# Patient Record
Sex: Male | Born: 1955
Health system: Southern US, Community
[De-identification: ages and names within clinical notes are randomized; demographics above are authoritative.]

## PROBLEM LIST (undated history)

## (undated) DIAGNOSIS — E119 Type 2 diabetes mellitus without complications: Secondary | ICD-10-CM

## (undated) DIAGNOSIS — E785 Hyperlipidemia, unspecified: Secondary | ICD-10-CM

## (undated) DIAGNOSIS — I10 Essential (primary) hypertension: Secondary | ICD-10-CM

---

## 2002-03-25 ENCOUNTER — Inpatient Hospital Stay (HOSPITAL_COMMUNITY): Admission: EM | Admit: 2002-03-25 | Discharge: 2002-03-26 | Payer: Self-pay | Admitting: Internal Medicine

## 2004-10-06 ENCOUNTER — Inpatient Hospital Stay (HOSPITAL_COMMUNITY): Admission: EM | Admit: 2004-10-06 | Discharge: 2004-10-08 | Payer: Self-pay | Admitting: Emergency Medicine

## 2004-11-17 ENCOUNTER — Encounter: Admission: RE | Admit: 2004-11-17 | Discharge: 2004-11-17 | Payer: Self-pay | Admitting: General Surgery

## 2009-05-19 ENCOUNTER — Inpatient Hospital Stay (HOSPITAL_COMMUNITY): Admission: EM | Admit: 2009-05-19 | Discharge: 2009-05-27 | Payer: Self-pay

## 2010-06-29 ENCOUNTER — Emergency Department (HOSPITAL_COMMUNITY): Admission: EM | Admit: 2010-06-29 | Discharge: 2010-06-29 | Payer: Self-pay | Admitting: Emergency Medicine

## 2010-07-02 ENCOUNTER — Emergency Department (HOSPITAL_COMMUNITY): Admission: EM | Admit: 2010-07-02 | Discharge: 2010-07-02 | Payer: Self-pay | Admitting: Emergency Medicine
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
## 2011-03-08 LAB — WOUND CULTURE

## 2011-03-30 LAB — GLUCOSE, CAPILLARY
Glucose-Capillary: 141 mg/dL — ABNORMAL HIGH (ref 70–99)
Glucose-Capillary: 148 mg/dL — ABNORMAL HIGH (ref 70–99)
Glucose-Capillary: 148 mg/dL — ABNORMAL HIGH (ref 70–99)
Glucose-Capillary: 151 mg/dL — ABNORMAL HIGH (ref 70–99)
Glucose-Capillary: 152 mg/dL — ABNORMAL HIGH (ref 70–99)
Glucose-Capillary: 155 mg/dL — ABNORMAL HIGH (ref 70–99)
Glucose-Capillary: 155 mg/dL — ABNORMAL HIGH (ref 70–99)
Glucose-Capillary: 156 mg/dL — ABNORMAL HIGH (ref 70–99)
Glucose-Capillary: 159 mg/dL — ABNORMAL HIGH (ref 70–99)
Glucose-Capillary: 160 mg/dL — ABNORMAL HIGH (ref 70–99)
Glucose-Capillary: 162 mg/dL — ABNORMAL HIGH (ref 70–99)
Glucose-Capillary: 165 mg/dL — ABNORMAL HIGH (ref 70–99)
Glucose-Capillary: 175 mg/dL — ABNORMAL HIGH (ref 70–99)
Glucose-Capillary: 177 mg/dL — ABNORMAL HIGH (ref 70–99)
Glucose-Capillary: 177 mg/dL — ABNORMAL HIGH (ref 70–99)
Glucose-Capillary: 181 mg/dL — ABNORMAL HIGH (ref 70–99)
Glucose-Capillary: 181 mg/dL — ABNORMAL HIGH (ref 70–99)
Glucose-Capillary: 189 mg/dL — ABNORMAL HIGH (ref 70–99)
Glucose-Capillary: 192 mg/dL — ABNORMAL HIGH (ref 70–99)
Glucose-Capillary: 196 mg/dL — ABNORMAL HIGH (ref 70–99)
Glucose-Capillary: 398 mg/dL — ABNORMAL HIGH (ref 70–99)
Glucose-Capillary: 96 mg/dL (ref 70–99)

## 2011-03-30 LAB — CBC
HCT: 36.5 % — ABNORMAL LOW (ref 39.0–52.0)
Hemoglobin: 12.5 g/dL — ABNORMAL LOW (ref 13.0–17.0)
MCHC: 34.4 g/dL (ref 30.0–36.0)
MCHC: 34.7 g/dL (ref 30.0–36.0)
MCV: 92.8 fL (ref 78.0–100.0)
MCV: 93.7 fL (ref 78.0–100.0)
Platelets: 109 10*3/uL — ABNORMAL LOW (ref 150–400)
Platelets: 167 10*3/uL (ref 150–400)
Platelets: 183 10*3/uL (ref 150–400)
RBC: 3.07 MIL/uL — ABNORMAL LOW (ref 4.22–5.81)
RBC: 3.89 MIL/uL — ABNORMAL LOW (ref 4.22–5.81)
RDW: 12.8 % (ref 11.5–15.5)
RDW: 13.2 % (ref 11.5–15.5)
WBC: 10.8 10*3/uL — ABNORMAL HIGH (ref 4.0–10.5)
WBC: 8.1 10*3/uL (ref 4.0–10.5)
WBC: 9.6 10*3/uL (ref 4.0–10.5)

## 2011-03-30 LAB — DIFFERENTIAL
Basophils Absolute: 0 10*3/uL (ref 0.0–0.1)
Basophils Relative: 0 % (ref 0–1)
Eosinophils Absolute: 0.1 10*3/uL (ref 0.0–0.7)
Eosinophils Relative: 1 % (ref 0–5)
Lymphs Abs: 1.2 10*3/uL (ref 0.7–4.0)
Neutrophils Relative %: 74 % (ref 43–77)

## 2011-03-30 LAB — COMPREHENSIVE METABOLIC PANEL
ALT: 33 U/L (ref 0–53)
AST: 48 U/L — ABNORMAL HIGH (ref 0–37)
Albumin: 2.7 g/dL — ABNORMAL LOW (ref 3.5–5.2)
Alkaline Phosphatase: 57 U/L (ref 39–117)
BUN: 8 mg/dL (ref 6–23)
CO2: 29 mEq/L (ref 19–32)
Calcium: 8.2 mg/dL — ABNORMAL LOW (ref 8.4–10.5)
Chloride: 105 mEq/L (ref 96–112)
Creatinine, Ser: 0.67 mg/dL (ref 0.4–1.5)
GFR calc Af Amer: >60 mL/min (ref >60)
GFR calc non Af Amer: >60 mL/min (ref >60)
Glucose, Bld: 173 mg/dL — ABNORMAL HIGH (ref 70–99)
Potassium: 3.8 mEq/L (ref 3.5–5.1)
Sodium: 138 mEq/L (ref 135–145)
Total Bilirubin: 1.2 mg/dL (ref 0.3–1.2)
Total Protein: 5.8 g/dL — ABNORMAL LOW (ref 6.0–8.3)

## 2011-03-30 LAB — CROSSMATCH

## 2011-03-30 LAB — BASIC METABOLIC PANEL
BUN: 11 mg/dL (ref 6–23)
Chloride: 103 mEq/L (ref 96–112)
Creatinine, Ser: 0.6 mg/dL (ref 0.4–1.5)
Glucose, Bld: 158 mg/dL — ABNORMAL HIGH (ref 70–99)

## 2011-03-30 LAB — HEMOGLOBIN AND HEMATOCRIT, BLOOD
HCT: 28.5 % — ABNORMAL LOW (ref 39.0–52.0)
Hemoglobin: 9.8 g/dL — ABNORMAL LOW (ref 13.0–17.0)

## 2011-03-31 LAB — POCT I-STAT EG7
Bicarbonate: 18.9 meq/L — ABNORMAL LOW (ref 20.0–24.0)
Hemoglobin: 13.9 g/dL (ref 13.0–17.0)
O2 Saturation: 96 %
Potassium: 4.2 meq/L (ref 3.5–5.1)
TCO2: 20 mmol/L (ref 0–100)
pCO2, Ven: 38.8 mmHg — ABNORMAL LOW (ref 45.0–50.0)
pH, Ven: 7.295 (ref 7.250–7.300)
pO2, Ven: 93 mmHg — ABNORMAL HIGH (ref 30.0–45.0)

## 2011-03-31 LAB — POCT I-STAT 3, ART BLOOD GAS (G3+)
O2 Saturation: 92 %
Patient temperature: 98
pCO2 arterial: 44.5 mmHg (ref 35.0–45.0)

## 2011-03-31 LAB — CBC
Hemoglobin: 15.4 g/dL (ref 13.0–17.0)
MCHC: 34.1 g/dL (ref 30.0–36.0)
MCV: 93 fL (ref 78.0–100.0)
MCV: 93.3 fL (ref 78.0–100.0)
Platelets: 121 10*3/uL — ABNORMAL LOW (ref 150–400)
RBC: 4.85 MIL/uL (ref 4.22–5.81)
RDW: 12.8 % (ref 11.5–15.5)
WBC: 12.1 10*3/uL — ABNORMAL HIGH (ref 4.0–10.5)

## 2011-03-31 LAB — POCT I-STAT, CHEM 8
BUN: 8 mg/dL (ref 6–23)
Chloride: 103 meq/L (ref 96–112)
Creatinine, Ser: 1 mg/dL (ref 0.4–1.5)
Sodium: 140 meq/L (ref 135–145)

## 2011-03-31 LAB — GLUCOSE, CAPILLARY
Glucose-Capillary: 109 mg/dL — ABNORMAL HIGH (ref 70–99)
Glucose-Capillary: 175 mg/dL — ABNORMAL HIGH (ref 70–99)

## 2011-03-31 LAB — HEMOGLOBIN A1C
Hgb A1c MFr Bld: 7.6 % — ABNORMAL HIGH (ref 4.6–6.1)
Mean Plasma Glucose: 171 mg/dL

## 2011-03-31 LAB — BASIC METABOLIC PANEL
BUN: 8 mg/dL (ref 6–23)
Calcium: 8 mg/dL — ABNORMAL LOW (ref 8.4–10.5)
Chloride: 104 mEq/L (ref 96–112)
Creatinine, Ser: 0.83 mg/dL (ref 0.4–1.5)

## 2011-03-31 LAB — SAMPLE TO BLOOD BANK

## 2011-05-05 NOTE — Op Note (Signed)
NAMEJAEGER, TRUEHEART              ACCOUNT NO.:  [id number]   MEDICAL RECORD NO.:  [id number]          PATIENT TYPE:  INP   LOCATION:  2304                         FACILITY:  [facility]   PHYSICIAN:  Elana Alm. Thurston Hole, M.D. DATE OF BIRTH:  1956-08-16   DATE OF PROCEDURE:  05/19/2009  DATE OF DISCHARGE:                               OPERATIVE REPORT   PREOPERATIVE DIAGNOSIS:  Right hip posterior fracture dislocation.   POSTOPERATIVE DIAGNOSIS:  Right hip posterior fracture dislocation.   PROCEDURE:  Closed reduction with distal femoral traction pin placement,  right hip and femur.   SURGEON:  Elana Alm. Thurston Hole, MD   ASSISTANT:  Oris Drone. Petrarca, PA-C   ANESTHESIA:  General.   OPERATIVE TIME:  45 minutes.   COMPLICATIONS:  None.   DESCRIPTION OF PROCEDURE:  Mr. Zeitz was brought to the operating room  on May 19, 2009.  He was placed on operative table in supine position.  After being placed under general anesthesia, he had his right hip  reduced in a gentle flexion and longitudinal traction maneuver with  satisfactory closed reduction obtained.  Intraoperative fluoroscopy  confirmed anatomic reduction of the hip joint, although the posterior  wall fracture remained slightly displaced.  The hip was satisfactorily  reduced.  After this was done, then a distal femoral traction pin was  placed from lateral to medial, carefully avoiding neurovascular bundle  after the distal thigh and knee area was prepped and draped.  After this  pin was placed, then 25 pounds of longitudinal traction was placed.  The  patient was transferred back to his hospital bed and then another x-ray  after being transferred to the hospital bed AP pelvis x-ray was obtained  that showed the reduction was maintained.  At this point, the patient  was awakened, extubated, and taken to recovery room in stable condition.  Needle and sponge counts were correct x2 at the end of the case.      Robert A. Thurston Hole,  M.D.  Electronically Signed     RAW/MEDQ  D:  05/19/2009  T:  05/19/2009  Job:  [id number]
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
## 2011-05-08 NOTE — Discharge Summary (Signed)
[facility]  Patient:    Philip [name], Philip [name] Visit Number: [id number] MRN: [id number]          Service Type: MED Location: [phone] Attending Physician:  Georgann Housekeeper Dictated by:   Pearla Dubonnet, M.D. Admit Date:  03/25/2002 Discharge Date: 03/26/2002   CC:         Desma Maxim, M.D.   Discharge Summary  DATE OF BIRTH:  [birth year]  DISCHARGE DIAGNOSIS:  Scalp and facial cellulitis--improving.  DISCHARGE MEDICATIONS:  Keflex 500 mg four times a day for 10 days.  HOSPITAL COURSE:  Mr. Crutchfield is a pleasant 55 year old male who about six days prior to admission had hit his head on a garage door and suffered about a 2-cm laceration.  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  One day prior to admission, he developed fever, chills, malaise, vomiting, diarrhea, and rapidly spreading erythema over the left face and scalp.  He has had no eye pain or change in vision.  His temperature prior to admission was 101.6 but he has been afebrile for the last 18 hours.  He feels well in general and has had now had two doses of Unasyn to be followed by one right before discharge and, with the most likely organism being Strep and Staph, I think it is okay for him to be discharged at this point with the erythema no longer spreading and the patient afebrile and doing well.  PHYSICAL EXAMINATION:  HEENT:  On discharge, his erythema involved areas over the left parotid gland, left face, and almost entire upper scalp.  He had no areas of fluctuance and the rash had not progressed at all since admission, as outlined with a marker. There was no fluctuance over the area of healed laceration over the vertex of the skull.  LABORATORY DATA:  Laboratories on discharge reveal a white count of 12,200, hemoglobin 14.6, platelet count 148,000.   His white count on admission was 13,900 with 80% neutrophils.  There are no blood cultures reported for laboratory.  DISCHARGE INSTRUCTIONS:  The patient has been instructed to take his Keflex four times a day for 10 days and he has been instructed to not work on March 27, 2002 but, if he feels well on March 28, 2002 and the erythema is resolving, he can return to work.  He will contact Dr. Donia Guiles office in several days to let them know that he was hospitalized for a skin infection and to let them know how he is feeling.  Any temperature spike to 101 or greater or increase in erythema or if he notices worsening symptoms instead of improvement on a daily basis, he is to contact Dr. Fonnie Birkenhead office. Dictated by:   Pearla Dubonnet, M.D. Attending Physician:  Georgann Housekeeper DD:  03/26/02 TD:  03/27/02 Job: [id number]

## 2011-05-08 NOTE — H&P (Signed)
[name], [name]              ACCOUNT NO.:  [id number]   MEDICAL RECORD NO.:  [id number]          PATIENT TYPE:  INP   LOCATION:  0444                         FACILITY:  [facility]   PHYSICIAN:  Anselm Pancoast. Weatherly, M.D.DATE OF BIRTH:  October 26, 1956   DATE OF ADMISSION:  10/06/2004  DATE OF DISCHARGE:                                HISTORY & PHYSICAL   CHIEF COMPLAINT:  Lower abdominal pain approximately 2 1/2 days duration.   HISTORY:  Philip [name] is a 55 year old 180-pound male who was referred to  the emergency room after I was called from urgent care where the patient had  been seen the preceding evening with the following history.  The patient  states that on Friday, that was 2 1/2 days earlier, he had the onset of  lower abdominal pain.  I think this occurred after having a bowel movement.  He said he was able to take liquids, but he was sore.  Continued to be sore  for the next day and a half, but with increasing pain presented to the  urgent care where Dr. Reed Breech saw the patient and obtained laboratory  studies.  His findings were lower abdominal tenderness predominantly the  left.  White count was 11,200, and his clinical impression was that this was  probably acute diverticulitis, but he treated the patient with pain  medication and suggested he obtain a CT the following morning.  The  abdominal findings on the CT showed acute sigmoid diverticulitis with one  little small area of free air, but a fairly marked inflammatory response  along the sigmoid loop, and it was after this CT that they called and  suggested to send him to the emergency room.  He arrived there approximately  12:30 p.m. and he was not febrile, but was started on IV Unasyn and  Flagyl.  I saw the patient approximately 2 hours later whereon physical exam his  findings were definitely consistent with acute diverticulitis that is  localized and hopefully this is something that can be treated medically and  then a follow up either CT or barium enema performed.  Whether or not he  will need surgery is inconclusive at this time.  The patient's wife who was  not at home when he had the onset of  pain on Friday, she had gone to the  beach, said that he has had previous episodes of cramping, bloating, gas,  kind of complaining of lower abdominal discomfort, but had never actually  seen a physician or had any type of colon evaluations.   As far as past history, he gives no history of allergies.  He works as a  Curator.  Do not think he has actually had any previous surgeries.  No  history of hypertension or other problems.  He has had an ear surgery in the  past.  He was on no chronic medications.   PHYSICAL EXAMINATION:  VITAL SIGNS:  Temperature 97, pulse 73, respirations  20, blood pressure 136/80, he is 5 feet 11 inches and weighs 208 pounds.  HEENT:  Eyes, ears, nose, and throat normal.  Well-hydrated.  No cervical or  supraclavicular lymphadenopathy.  Good breath sounds bilaterally.  CARDIAC:  Normal sinus rhythm.  No cervical or supraclavicular or axillary  lymphadenopathy.  ABDOMEN:  He is definitely tender with mild muscle guarding in the left  lower quadrant fairly well localized and there is certainly not any  generalized peritonitis.  RECTUM:  Small amount of stool in the rectum.  Says he has had frequent  loose bowel movements over the last several days, but has not noticed any  blood.  EXTREMITIES:  No pedal edema.   PLAN:  The patient will be admitted, IV antibiotics, I am going to keep him  n.p.o. and re-examine him in the morning.  He clinically acts as if he  probably would not require urgent surgery as far as the inflammation of this  little minute area of free or extra luminal air.  It is thought to be well  localized, but possibly would develop into a pericolonic abscess, but  clinically he does not appear that acutely ill from my opinion.  We will  obtain repeat  laboratory studies for his white count in the morning and go  accordingly with advancing his diet with other tests.  I have discussed with  him about that if in the best case scenario his symptoms will subside,  switch him from IV to oral antibiotics, complete approximately two-week  course, and a followup either CT or barium enema according to how clinically  the patient.  He is aware that if he were to develop a generalized  peritonitis he might require emergency surgery that would require temporary  colostomy.      WJW/MEDQ  D:  10/07/2004  T:  10/07/2004  Job:  [id number]

## 2018-11-15 ENCOUNTER — Inpatient Hospital Stay (HOSPITAL_COMMUNITY)
Admission: EM | Admit: 2018-11-15 | Discharge: 2018-11-23 | DRG: 232 | Disposition: A | Discharge status: Home / Self Care | Payer: BLUE CROSS/BLUE SHIELD | Source: Ambulatory Visit | Attending: Cardiothoracic Surgery | Admitting: Cardiothoracic Surgery

## 2018-11-15 ENCOUNTER — Encounter (HOSPITAL_COMMUNITY): Payer: Self-pay

## 2018-11-15 ENCOUNTER — Emergency Department (HOSPITAL_COMMUNITY): Payer: BLUE CROSS/BLUE SHIELD

## 2018-11-15 ENCOUNTER — Encounter (HOSPITAL_COMMUNITY)
Admission: EM | Disposition: A | Discharge status: Home / Self Care | Payer: Self-pay | Source: Home / Self Care | Attending: Cardiothoracic Surgery

## 2018-11-15 DIAGNOSIS — E119 Type 2 diabetes mellitus without complications: Secondary | ICD-10-CM | POA: Diagnosis present

## 2018-11-15 DIAGNOSIS — I213 ST elevation (STEMI) myocardial infarction of unspecified site: Secondary | ICD-10-CM | POA: Diagnosis not present

## 2018-11-15 DIAGNOSIS — K5900 Constipation, unspecified: Secondary | ICD-10-CM | POA: Diagnosis present

## 2018-11-15 DIAGNOSIS — I255 Ischemic cardiomyopathy: Secondary | ICD-10-CM | POA: Diagnosis present

## 2018-11-15 DIAGNOSIS — E8770 Fluid overload, unspecified: Secondary | ICD-10-CM | POA: Diagnosis not present

## 2018-11-15 DIAGNOSIS — E785 Hyperlipidemia, unspecified: Secondary | ICD-10-CM | POA: Diagnosis present

## 2018-11-15 DIAGNOSIS — Z01810 Encounter for preprocedural cardiovascular examination: Secondary | ICD-10-CM | POA: Diagnosis not present

## 2018-11-15 DIAGNOSIS — I10 Essential (primary) hypertension: Secondary | ICD-10-CM

## 2018-11-15 DIAGNOSIS — R079 Chest pain, unspecified: Secondary | ICD-10-CM | POA: Diagnosis not present

## 2018-11-15 DIAGNOSIS — I2510 Atherosclerotic heart disease of native coronary artery without angina pectoris: Secondary | ICD-10-CM | POA: Diagnosis present

## 2018-11-15 DIAGNOSIS — I2119 ST elevation (STEMI) myocardial infarction involving other coronary artery of inferior wall: Secondary | ICD-10-CM | POA: Diagnosis not present

## 2018-11-15 DIAGNOSIS — Z4659 Encounter for fitting and adjustment of other gastrointestinal appliance and device: Secondary | ICD-10-CM

## 2018-11-15 DIAGNOSIS — D689 Coagulation defect, unspecified: Secondary | ICD-10-CM | POA: Diagnosis not present

## 2018-11-15 DIAGNOSIS — I2121 ST elevation (STEMI) myocardial infarction involving left circumflex coronary artery: Secondary | ICD-10-CM | POA: Diagnosis present

## 2018-11-15 DIAGNOSIS — D62 Acute posthemorrhagic anemia: Secondary | ICD-10-CM | POA: Diagnosis not present

## 2018-11-15 DIAGNOSIS — J90 Pleural effusion, not elsewhere classified: Secondary | ICD-10-CM

## 2018-11-15 DIAGNOSIS — I25110 Atherosclerotic heart disease of native coronary artery with unstable angina pectoris: Secondary | ICD-10-CM | POA: Diagnosis not present

## 2018-11-15 DIAGNOSIS — Z419 Encounter for procedure for purposes other than remedying health state, unspecified: Secondary | ICD-10-CM

## 2018-11-15 DIAGNOSIS — Z951 Presence of aortocoronary bypass graft: Secondary | ICD-10-CM

## 2018-11-15 DIAGNOSIS — I249 Acute ischemic heart disease, unspecified: Secondary | ICD-10-CM

## 2018-11-15 HISTORY — DX: Hyperlipidemia, unspecified: E78.5

## 2018-11-15 HISTORY — PX: LEFT HEART CATH AND CORONARY ANGIOGRAPHY: CATH118249

## 2018-11-15 HISTORY — DX: Type 2 diabetes mellitus without complications: E11.9

## 2018-11-15 HISTORY — DX: Essential (primary) hypertension: I10

## 2018-11-15 LAB — CBC
HEMATOCRIT: 44.9 % (ref 39.0–52.0)
HEMOGLOBIN: 14.9 g/dL (ref 13.0–17.0)
MCH: 30.2 pg (ref 26.0–34.0)
MCHC: 33.2 g/dL (ref 30.0–36.0)
MCV: 91.1 fL (ref 80.0–100.0)
Platelets: 209 10*3/uL (ref 150–400)
RBC: 4.93 MIL/uL (ref 4.22–5.81)
RDW: 11.8 % (ref 11.5–15.5)
WBC: 9.8 10*3/uL (ref 4.0–10.5)
nRBC: 0 % (ref 0.0–0.2)

## 2018-11-15 LAB — POCT ACTIVATED CLOTTING TIME: ACTIVATED CLOTTING TIME: 439 s

## 2018-11-15 LAB — BASIC METABOLIC PANEL
Anion gap: 9 (ref 5–15)
BUN: 8 mg/dL (ref 8–23)
CHLORIDE: 99 mmol/L (ref 98–111)
CO2: 27 mmol/L (ref 22–32)
Calcium: 10.5 mg/dL — ABNORMAL HIGH (ref 8.9–10.3)
Creatinine, Ser: 0.93 mg/dL (ref 0.61–1.24)
GFR calc Af Amer: >60 mL/min (ref >60)
GFR calc non Af Amer: >60 mL/min (ref >60)
Glucose, Bld: 272 mg/dL — ABNORMAL HIGH (ref 70–99)
POTASSIUM: 3.7 mmol/L (ref 3.5–5.1)
SODIUM: 135 mmol/L (ref 135–145)

## 2018-11-15 LAB — GLUCOSE, CAPILLARY: Glucose-Capillary: 203 mg/dL — ABNORMAL HIGH (ref 70–99)

## 2018-11-15 LAB — SURGICAL PCR SCREEN
MRSA, PCR: NEGATIVE
Staphylococcus aureus: NEGATIVE

## 2018-11-15 LAB — I-STAT TROPONIN, ED: Troponin i, poc: 17.9 ng/mL (ref 0.00–0.08)

## 2018-11-15 LAB — TROPONIN I: Troponin I: >65 ng/mL (ref <0.03)

## 2018-11-15 SURGERY — LEFT HEART CATH AND CORONARY ANGIOGRAPHY
Anesthesia: LOCAL

## 2018-11-15 MED ORDER — SODIUM CHLORIDE 0.9 % IV SOLN: 250.0000 mL | INTRAVENOUS | Status: DC | PRN

## 2018-11-15 MED ORDER — LIDOCAINE HCL (PF) 1 % IJ SOLN
INTRAMUSCULAR | Status: DC | PRN
  Administered 2018-11-15: 2 mL

## 2018-11-15 MED ORDER — HEPARIN (PORCINE) IN NACL 1000-0.9 UT/500ML-% IV SOLN
INTRAVENOUS | Status: DC | PRN
  Administered 2018-11-15: 500 mL

## 2018-11-15 MED ORDER — SODIUM CHLORIDE 0.9 % IV SOLN
INTRAVENOUS | Status: AC | PRN
  Administered 2018-11-15: 1.75 mg/kg/h via INTRAVENOUS

## 2018-11-15 MED ORDER — BIVALIRUDIN TRIFLUOROACETATE 250 MG IV SOLR
INTRAVENOUS | Status: AC
  Filled 2018-11-15: qty 250

## 2018-11-15 MED ORDER — SODIUM CHLORIDE 0.9% FLUSH
3.0000 mL | Freq: Two times a day (BID) | INTRAVENOUS | Status: DC
  Administered 2018-11-16 – 2018-11-17 (×4): 3 mL via INTRAVENOUS

## 2018-11-15 MED ORDER — ASPIRIN 81 MG PO CHEW
81.0000 mg | CHEWABLE_TABLET | Freq: Every day | ORAL | Status: DC
  Administered 2018-11-16 – 2018-11-17 (×2): 81 mg via ORAL
  Filled 2018-11-15 (×2): qty 1

## 2018-11-15 MED ORDER — HYDRALAZINE HCL 20 MG/ML IJ SOLN: 5.0000 mg | INTRAMUSCULAR | Status: AC | PRN

## 2018-11-15 MED ORDER — HEPARIN (PORCINE) IN NACL 1000-0.9 UT/500ML-% IV SOLN
INTRAVENOUS | Status: AC
  Filled 2018-11-15: qty 1000

## 2018-11-15 MED ORDER — SODIUM CHLORIDE 0.9 % IV SOLN
1.7500 mg/kg/h | INTRAVENOUS | Status: DC
  Administered 2018-11-15: 1.75 mg/kg/h via INTRAVENOUS
  Filled 2018-11-15: qty 250

## 2018-11-15 MED ORDER — HEPARIN BOLUS VIA INFUSION
4000.0000 [IU] | Freq: Once | INTRAVENOUS | Status: DC
  Filled 2018-11-15: qty 4000

## 2018-11-15 MED ORDER — TIROFIBAN (AGGRASTAT) BOLUS VIA INFUSION
INTRAVENOUS | Status: DC | PRN
  Administered 2018-11-15: 2017.5 ug via INTRAVENOUS

## 2018-11-15 MED ORDER — NITROGLYCERIN 1 MG/10 ML FOR IR/CATH LAB
INTRA_ARTERIAL | Status: DC | PRN
  Administered 2018-11-15 (×2): 200 ug via INTRACORONARY

## 2018-11-15 MED ORDER — VERAPAMIL HCL 2.5 MG/ML IV SOLN
INTRAVENOUS | Status: AC
  Filled 2018-11-15: qty 2

## 2018-11-15 MED ORDER — SODIUM CHLORIDE 0.9 % IV SOLN
1.7500 mg/kg/h | INTRAVENOUS | Status: DC
  Administered 2018-11-15 (×2): 1.75 mg/kg/h via INTRAVENOUS
  Filled 2018-11-15: qty 250

## 2018-11-15 MED ORDER — FENTANYL CITRATE (PF) 100 MCG/2ML IJ SOLN
INTRAMUSCULAR | Status: AC
  Filled 2018-11-15: qty 2

## 2018-11-15 MED ORDER — HEPARIN SODIUM (PORCINE) 1000 UNIT/ML IJ SOLN
INTRAMUSCULAR | Status: DC | PRN
  Administered 2018-11-15: 4000 [IU] via INTRAVENOUS

## 2018-11-15 MED ORDER — NITROGLYCERIN 0.4 MG SL SUBL
0.4000 mg | SUBLINGUAL_TABLET | SUBLINGUAL | Status: DC | PRN
  Administered 2018-11-15: 0.4 mg via SUBLINGUAL
  Filled 2018-11-15: qty 1

## 2018-11-15 MED ORDER — BIVALIRUDIN BOLUS VIA INFUSION - CUPID
INTRAVENOUS | Status: DC | PRN
  Administered 2018-11-15: 60.525 mg via INTRAVENOUS

## 2018-11-15 MED ORDER — ONDANSETRON HCL 4 MG/2ML IJ SOLN
4.0000 mg | Freq: Four times a day (QID) | INTRAMUSCULAR | Status: DC | PRN
  Administered 2018-11-18: 4 mg via INTRAVENOUS

## 2018-11-15 MED ORDER — TIROFIBAN HCL IV 12.5 MG/250 ML
INTRAVENOUS | Status: AC
  Filled 2018-11-15: qty 250

## 2018-11-15 MED ORDER — ATORVASTATIN CALCIUM 80 MG PO TABS
80.0000 mg | ORAL_TABLET | Freq: Every day | ORAL | Status: DC
  Administered 2018-11-16 – 2018-11-22 (×6): 80 mg via ORAL
  Filled 2018-11-15 (×6): qty 1

## 2018-11-15 MED ORDER — VERAPAMIL HCL 2.5 MG/ML IV SOLN
INTRAVENOUS | Status: DC | PRN
  Administered 2018-11-15: 10 mL via INTRA_ARTERIAL

## 2018-11-15 MED ORDER — SODIUM CHLORIDE 0.9 % IV SOLN: INTRAVENOUS | Status: AC

## 2018-11-15 MED ORDER — MIDAZOLAM HCL 2 MG/2ML IJ SOLN
INTRAMUSCULAR | Status: AC
  Filled 2018-11-15: qty 2

## 2018-11-15 MED ORDER — METOPROLOL TARTRATE 12.5 MG HALF TABLET
12.5000 mg | ORAL_TABLET | Freq: Two times a day (BID) | ORAL | Status: DC
  Administered 2018-11-15 – 2018-11-17 (×5): 12.5 mg via ORAL
  Filled 2018-11-15 (×5): qty 1

## 2018-11-15 MED ORDER — MIDAZOLAM HCL 2 MG/2ML IJ SOLN
INTRAMUSCULAR | Status: DC | PRN
  Administered 2018-11-15: 2 mg via INTRAVENOUS

## 2018-11-15 MED ORDER — INSULIN ASPART 100 UNIT/ML ~~LOC~~ SOLN
0.0000 [IU] | Freq: Three times a day (TID) | SUBCUTANEOUS | Status: DC
  Administered 2018-11-16: 5 [IU] via SUBCUTANEOUS
  Administered 2018-11-16: 2 [IU] via SUBCUTANEOUS
  Administered 2018-11-16 – 2018-11-17 (×2): 5 [IU] via SUBCUTANEOUS
  Administered 2018-11-17 (×2): 3 [IU] via SUBCUTANEOUS
  Administered 2018-11-18: 2 [IU] via SUBCUTANEOUS

## 2018-11-15 MED ORDER — HEPARIN (PORCINE) 25000 UT/250ML-% IV SOLN: 1000.0000 [IU]/h | INTRAVENOUS | Status: DC

## 2018-11-15 MED ORDER — ACETAMINOPHEN 325 MG PO TABS
650.0000 mg | ORAL_TABLET | ORAL | Status: DC | PRN
  Administered 2018-11-16: 650 mg via ORAL
  Filled 2018-11-15: qty 2

## 2018-11-15 MED ORDER — LABETALOL HCL 5 MG/ML IV SOLN: 10.0000 mg | INTRAVENOUS | Status: AC | PRN

## 2018-11-15 MED ORDER — SODIUM CHLORIDE 0.9 % IV SOLN
INTRAVENOUS | Status: AC | PRN
  Administered 2018-11-15: 10 mL/h via INTRAVENOUS
  Administered 2018-11-15: 81 mL/h via INTRAVENOUS

## 2018-11-15 MED ORDER — TIROFIBAN HCL IV 12.5 MG/250 ML
INTRAVENOUS | Status: AC | PRN
  Administered 2018-11-15: 0.15 ug/kg/min via INTRAVENOUS

## 2018-11-15 MED ORDER — DIAZEPAM 5 MG PO TABS: 5.0000 mg | ORAL_TABLET | Freq: Four times a day (QID) | ORAL | Status: DC | PRN

## 2018-11-15 MED ORDER — LIDOCAINE HCL (PF) 1 % IJ SOLN
INTRAMUSCULAR | Status: AC
  Filled 2018-11-15: qty 30

## 2018-11-15 MED ORDER — NITROGLYCERIN IN D5W 200-5 MCG/ML-% IV SOLN
INTRAVENOUS | Status: AC | PRN
  Administered 2018-11-15: 20 ug/min via INTRAVENOUS

## 2018-11-15 MED ORDER — NITROGLYCERIN IN D5W 200-5 MCG/ML-% IV SOLN: 0.0000 ug/min | INTRAVENOUS | Status: DC

## 2018-11-15 MED ORDER — FENTANYL CITRATE (PF) 100 MCG/2ML IJ SOLN
50.0000 ug | Freq: Once | INTRAMUSCULAR | Status: AC
  Administered 2018-11-15: 50 ug via INTRAVENOUS
  Filled 2018-11-15: qty 2

## 2018-11-15 MED ORDER — IOHEXOL 350 MG/ML SOLN
INTRAVENOUS | Status: DC | PRN
  Administered 2018-11-15: 180 mL via INTRACARDIAC

## 2018-11-15 MED ORDER — NITROGLYCERIN 1 MG/10 ML FOR IR/CATH LAB
INTRA_ARTERIAL | Status: AC
  Filled 2018-11-15: qty 10

## 2018-11-15 MED ORDER — NITROGLYCERIN IN D5W 200-5 MCG/ML-% IV SOLN
INTRAVENOUS | Status: AC
  Filled 2018-11-15: qty 250

## 2018-11-15 MED ORDER — TIROFIBAN HCL IN NACL 5-0.9 MG/100ML-% IV SOLN
0.1500 ug/kg/min | INTRAVENOUS | Status: AC
  Administered 2018-11-15 – 2018-11-16 (×3): 0.15 ug/kg/min via INTRAVENOUS
  Filled 2018-11-15 (×4): qty 100

## 2018-11-15 MED ORDER — SODIUM CHLORIDE 0.9% FLUSH: 3.0000 mL | INTRAVENOUS | Status: DC | PRN

## 2018-11-15 MED ORDER — FENTANYL CITRATE (PF) 100 MCG/2ML IJ SOLN
INTRAMUSCULAR | Status: DC | PRN
  Administered 2018-11-15: 50 ug via INTRAVENOUS
  Administered 2018-11-15: 25 ug via INTRAVENOUS

## 2018-11-15 SURGICAL SUPPLY — 18 items
BALLN SAPPHIRE 2.0X15 (BALLOONS) ×2
BALLN SAPPHIRE 2.5X15 (BALLOONS) ×2
BALLOON SAPPHIRE 2.0X15 (BALLOONS) ×1 IMPLANT
BALLOON SAPPHIRE 2.5X15 (BALLOONS) ×1 IMPLANT
CATH 5FR JL3.5 JR4 ANG PIG MP (CATHETERS) ×2 IMPLANT
CATH VISTA GUIDE 6FR XB3.5 (CATHETERS) ×2 IMPLANT
DEVICE RAD COMP TR BAND LRG (VASCULAR PRODUCTS) ×2 IMPLANT
ELECT DEFIB PAD ADLT CADENCE (PAD) ×2 IMPLANT
GLIDESHEATH SLEND SS 6F .021 (SHEATH) ×2 IMPLANT
GUIDEWIRE INQWIRE 1.5J.035X260 (WIRE) ×1 IMPLANT
INQWIRE 1.5J .035X260CM (WIRE) ×2
KIT ENCORE 26 ADVANTAGE (KITS) ×2 IMPLANT
KIT HEART LEFT (KITS) ×2 IMPLANT
PACK CARDIAC CATHETERIZATION (CUSTOM PROCEDURE TRAY) ×2 IMPLANT
SYR MEDRAD MARK 7 150ML (SYRINGE) ×2 IMPLANT
TRANSDUCER W/STOPCOCK (MISCELLANEOUS) ×2 IMPLANT
TUBING CIL FLEX 10 FLL-RA (TUBING) ×2 IMPLANT
WIRE PT2 MS 185 (WIRE) ×2 IMPLANT

## 2018-11-15 NOTE — ED Notes (Signed)
EDP at the bedside.  ?

## 2018-11-15 NOTE — ED Provider Notes (Signed)
[facility] Emergency Department Provider Note MRN:  [id number]  Arrival date & time: 11/15/18     Chief Complaint   Chest Pain   History of Present Illness   Philip [name] is a 62 y.o. year-old male with a history of hypertension, hyperlipidemia, diabetes presenting to the ED with chief complaint of chest pain.  Pain began last night, center of the chest, radiating to bilateral arms.  Described as a sharp/pressure pain.  Severe last night, improved today, currently 6 out of 10 in severity.  Denies headache or vision change, no fevers or cough recently, pain associated with shortness of breath, which is worse with exertion.  No abdominal pain.  Review of Systems  A complete 10 system review of systems was obtained and all systems are negative except as noted in the HPI and PMH.   Patient's Health History    Past Medical History:  Diagnosis Date  . Diabetes mellitus without complication (HCC)   . Hyperlipidemia   . Hypertension     History reviewed. No pertinent surgical history.  No family history on file.  Social History   Socioeconomic History  . Marital status: Single    Spouse name: Not on file  . Number of children: Not on file  . Years of education: Not on file  . Highest education level: Not on file  Occupational History  . Not on file  Social Needs  . Financial resource strain: Not on file  . Food insecurity:    Worry: Not on file    Inability: Not on file  . Transportation needs:    Medical: Not on file    Non-medical: Not on file  Tobacco Use  . Smoking status: Not on file  Substance and Sexual Activity  . Alcohol use: Not on file  . Drug use: Not on file  . Sexual activity: Not on file  Lifestyle  . Physical activity:    Days per week: Not on file    Minutes per session: Not on file  . Stress: Not on file  Relationships  . Social connections:    Talks on phone: Not on file    Gets together: Not on file    Attends religious  service: Not on file    Active member of club or organization: Not on file    Attends meetings of clubs or organizations: Not on file    Relationship status: Not on file  . Intimate partner violence:    Fear of current or ex partner: Not on file    Emotionally abused: Not on file    Physically abused: Not on file    Forced sexual activity: Not on file  Other Topics Concern  . Not on file  Social History Narrative  . Not on file     Physical Exam  Vital Signs and Nursing Notes reviewed Vitals:   11/15/18 2000 11/15/18 2100  BP: (!) 155/82 (!) 141/82  Pulse: 84 97  Resp: 15 10  Temp:    SpO2: 100% 100%    CONSTITUTIONAL: Well-appearing, NAD NEURO:  Alert and oriented x 3, no focal deficits EYES:  eyes equal and reactive ENT/NECK:  no LAD, no JVD CARDIO: Regular rate, well-perfused, normal S1 and S2 PULM:  CTAB no wheezing or rhonchi GI/GU:  normal bowel sounds, non-distended, non-tender MSK/SPINE:  No gross deformities, no edema SKIN:  no rash, atraumatic PSYCH:  Appropriate speech and behavior  Diagnostic and Interventional Summary    EKG  Interpretation  Date/Time:  Tuesday November 15 2018 15:13:16 EST Ventricular Rate:  89 PR Interval:  146 QRS Duration: 100 QT Interval:  368 QTC Calculation: 447 R Axis:   -17 Text Interpretation:  Normal sinus rhythm Low voltage QRS Possible Inferior infarct , age undetermined Abnormal ECG Confirmed by Kennis Carina [phone]) on 11/15/2018 4:35:27 PM      Labs Reviewed  BASIC METABOLIC PANEL - Abnormal; Notable for the following components:      Result Value   Glucose, Bld 272 (*)    Calcium 10.5 (*)    All other components within normal limits  TROPONIN I - Abnormal; Notable for the following components:   Troponin I >65.00 (*)    All other components within normal limits  I-STAT TROPONIN, ED - Abnormal; Notable for the following components:   Troponin i, poc 17.90 (*)    All other components within normal limits    SURGICAL PCR SCREEN  CBC  CBC  BASIC METABOLIC PANEL  HEPATIC FUNCTION PANEL  POCT ACTIVATED CLOTTING TIME    DG Chest 2 View  Final Result      Medications  nitroGLYCERIN (NITROSTAT) SL tablet 0.4 mg ( Sublingual MAR Unhold 11/15/18 1959)  labetalol (NORMODYNE,TRANDATE) injection 10 mg (has no administration in time range)  hydrALAZINE (APRESOLINE) injection 5 mg (has no administration in time range)  acetaminophen (TYLENOL) tablet 650 mg (has no administration in time range)  ondansetron (ZOFRAN) injection 4 mg (has no administration in time range)  0.9 %  sodium chloride infusion ( Intravenous IV Pump Association 11/15/18 2141)  sodium chloride flush (NS) 0.9 % injection 3 mL (has no administration in time range)  sodium chloride flush (NS) 0.9 % injection 3 mL (has no administration in time range)  0.9 %  sodium chloride infusion (has no administration in time range)  diazepam (VALIUM) tablet 5 mg (has no administration in time range)  atorvastatin (LIPITOR) tablet 80 mg (has no administration in time range)  aspirin chewable tablet 81 mg (has no administration in time range)  nitroGLYCERIN 50 mg in dextrose 5 % 250 mL (0.2 mg/mL) infusion (30 mcg/min Intravenous IV Pump Association 11/15/18 2000)  metoprolol tartrate (LOPRESSOR) tablet 12.5 mg (has no administration in time range)  tirofiban (AGGRASTAT) infusion 50 mcg/mL 100 mL (has no administration in time range)  bivalirudin (ANGIOMAX) 250 mg in sodium chloride 0.9 % 50 mL (5 mg/mL) infusion (has no administration in time range)  fentaNYL (SUBLIMAZE) injection 50 mcg (50 mcg Intravenous Given 11/15/18 1715)  0.9 %  sodium chloride infusion (  Rate/Dose Change 11/15/18 1919)  bivalirudin (ANGIOMAX) 250 mg in sodium chloride 0.9 % 50 mL (5 mg/mL) infusion (1.75 mg/kg/hr  80.7 kg Intravenous New Bag/Given 11/15/18 1842)  nitroGLYCERIN 50 mg in dextrose 5 % 250 mL (0.2 mg/mL) infusion (30 mcg/min Intravenous Rate/Dose Change  11/15/18 1921)  tirofiban (AGGRASTAT) infusion 50 mcg/mL 250 mL (  IV Pump Association 11/15/18 2000)     Procedures Critical Care Critical Care Documentation Critical care time provided by me (excluding procedures): 39 minutes  Condition necessitating critical care: Acute coronary syndrome  Components of critical care management: reviewing of prior records, laboratory and imaging interpretation, frequent re-examination and reassessment of vital signs, administration of nitroglycerin, IV heparin, discussion with consulting services    ED Course and Medical Decision Making  I have reviewed the triage vital signs and the nursing notes.  Pertinent labs & imaging results that were available during my care of the  patient were reviewed by me and considered in my medical decision making (see below for details).  I was called by triage nursing, informed that they were sending a chest pain patient back to room 39 with a troponin of 17.  I was able to look up his prior EKG, which was suspect for subtle ST elevation inferiorly.  Repeat EKG was done immediately upon arrival to his room, which revealed resolution of this ST elevation.  Currently without STEMI criteria.  Will give aspirin, heparin, manage pain, nitro tab, consult cardiology.  Discussed with cardiology, in the setting of resolved ST elevations, but a markedly elevated troponin and concerning story, will push for urgent catheterization.   Elmer SowMichael M. Pilar PlateBero, MD [facility] [email or web address]  Final Clinical Impressions(s) / ED Diagnoses     ICD-10-CM   1. Acute coronary syndrome [facility](HCC) I24.9     ED Discharge Orders    None         Sabas SousBero, Rebbecca Osuna M, MD 11/15/18 2207

## 2018-11-15 NOTE — Progress Notes (Signed)
ANTICOAGULATION CONSULT NOTE - Initial Consult  Pharmacy Consult for Heparin Indication: chest pain/ACS  No Known Allergies  Patient Measurements: Height: 5\' 11"  (180.3 cm) Weight: 178 lb (80.7 kg)(per patient) IBW/kg (Calculated) : 75.3 Heparin Dosing Weight: 80.7 kg  Vital Signs: Temp: 98.3 F (36.8 C) (11/26 1511) Temp Source: Oral (11/26 1511) BP: 168/88 (11/26 1511) Pulse Rate: 95 (11/26 1511)  Labs: Recent Labs    11/15/18 1545  HGB 14.9  HCT 44.9  PLT 209  CREATININE 0.93    Estimated Creatinine Clearance: 85.5 mL/min (by C-G formula based on SCr of 0.93 mg/dL).   Medical History: Past Medical History:  Diagnosis Date  . Diabetes mellitus without complication (HCC)   . Hyperlipidemia   . Hypertension     Assessment: Patient [age] yoM presenting with chest pain. Pharmacy has been consulted for heparin dosing. No anticoagulation PTA. HgB 14.9 and PLT 209.   Goal of Therapy:  Heparin level 0.3-0.7 units/ml Monitor platelets by anticoagulation protocol: Yes   Plan:  Give 4000 units bolus x 1 Start heparin infusion at 1000 units/hr Check anti-Xa level in 6 hours and daily while on heparin Continue to monitor H&H and platelets  Monitor s/sx of bleed  Chauncey Mannebecca Jillyn Stacey, Pharmacy Student

## 2018-11-15 NOTE — H&P (Signed)
Cardiology Admission History and Physical:   Patient ID: Philip [name] MRN: [id number]; DOB: 06-19-1956   Admission date: 11/15/2018  Primary Care Provider: Patient, No Pcp Per Primary Cardiologist: No primary care provider on file. New Primary Electrophysiologist:  None   Chief Complaint: Chest pain  Patient Profile:   Philip [name] is a 62 y.o. male with ST segment elevation myocardial infarction inferiorly with repeat EKG showing resolution.  Troponin 17  History of Present Illness:   Philip [name] is a 62 year old male with diabetes, hypertension, non-smoker with no early family history of coronary artery disease who began to have significant chest discomfort yesterday at approximately 2 PM that originally was intermittent.  He tried to burp to make it feel better.  Ended up going to sleep.  He woke up earlier than he usually does at approximately 3:30 AM and felt poor.  He fought through it and went to work and at 5:30 AM he was having significant chest discomfort and pressure with no radiation that almost put him to his knees he states.  His boss encouraged him to go to the doctor and he went to an urgent care who then transferred him or encouraged him to come to the emergency room.  He wanted to drive himself.  He states he had a bad experience once in an ambulance.  Initial ECG at 1545 demonstrated 1 to 2 mm of ST segment elevation in the inferior leads with biphasic T wave in V3.  On repeat EKG at 1649, ST segments have returned to baseline.  Currently in the emergency department, he is having 6/10 chest discomfort.  Blood pressure still remains elevated.  Chest x-ray personally reviewed shows no active cardiopulmonary disease.  Point-of-care troponin was 17.9.  Creatinine 0.93.  Glucose 272.   Past Medical History:  Diagnosis Date  . Diabetes mellitus without complication (HCC)   . Hyperlipidemia   . Hypertension     History reviewed. No pertinent surgical history.     Medications Prior to Admission: Prior to Admission medications   Not on File     Allergies:   No Known Allergies  Social History:   Social History   Socioeconomic History  . Marital status: Single    Spouse name: Not on file  . Number of children: Not on file  . Years of education: Not on file  . Highest education level: Not on file  Occupational History  . Not on file  Social Needs  . Financial resource strain: Not on file  . Food insecurity:    Worry: Not on file    Inability: Not on file  . Transportation needs:    Medical: Not on file    Non-medical: Not on file  Tobacco Use  . Smoking status: Not on file  Substance and Sexual Activity  . Alcohol use: Not on file  . Drug use: Not on file  . Sexual activity: Not on file  Lifestyle  . Physical activity:    Days per week: Not on file    Minutes per session: Not on file  . Stress: Not on file  Relationships  . Social connections:    Talks on phone: Not on file    Gets together: Not on file    Attends religious service: Not on file    Active member of club or organization: Not on file    Attends meetings of clubs or organizations: Not on file    Relationship status: Not on file  .  Intimate partner violence:    Fear of current or ex partner: Not on file    Emotionally abused: Not on file    Physically abused: Not on file    Forced sexual activity: Not on file  Other Topics Concern  . Not on file  Social History Narrative  . Not on file    Family History:   The patient's family history is not on file.  No early family history of CAD  ROS:  Please see the history of present illness.  Denies any fevers chills nausea vomiting syncope bleeding.  All other ROS reviewed and negative.     Physical Exam/Data:   Vitals:   11/15/18 1511 11/15/18 1700 11/15/18 1715  BP: (!) 168/88  (!) 183/97  Pulse: 95  83  Resp: 16  13  Temp: 98.3 F (36.8 C)    TempSrc: Oral    SpO2: 100%  98%  Weight:  80.7 kg    Height:  5\' 11"  (1.803 m)    No intake or output data in the 24 hours ending 11/15/18 1735 Filed Weights   11/15/18 1700  Weight: 80.7 kg   Body mass index is 24.83 kg/m.  General:  Well nourished, well developed, in no acute distress HEENT: normal Lymph: no adenopathy Neck: no JVD Endocrine:  No thryomegaly Vascular: No carotid bruits; FA pulses 2+ bilaterally without bruits, excellent radial pulses Cardiac:  normal S1, S2; RRR; no murmur  Lungs:  clear to auscultation bilaterally, no wheezing, rhonchi or rales  Abd: soft, nontender, no hepatomegaly  Ext: no edema Musculoskeletal:  No deformities, BUE and BLE strength normal and equal Skin: warm and dry, tattoos noted Neuro:  CNs 2-12 intact, no focal abnormalities noted Psych:  Normal affect    EKG:  The ECG that was done  was personally reviewed and demonstrates initially ST segment elevation in the inferior leads, subsequent EKG resolved.  See above for details.  Relevant CV Studies: Chronic catheterization pending  Laboratory Data:  Chemistry Recent Labs  Lab 11/15/18 1545  NA 135  K 3.7  CL 99  CO2 27  GLUCOSE 272*  BUN 8  CREATININE 0.93  CALCIUM 10.5*  GFRNONAA >60  GFRAA >60  ANIONGAP 9    No results for input(s): PROT, ALBUMIN, AST, ALT, ALKPHOS, BILITOT in the last 168 hours. Hematology Recent Labs  Lab 11/15/18 1545  WBC 9.8  RBC 4.93  HGB 14.9  HCT 44.9  MCV 91.1  MCH 30.2  MCHC 33.2  RDW 11.8  PLT 209   Cardiac EnzymesNo results for input(s): TROPONINI in the last 168 hours.  Recent Labs  Lab 11/15/18 1618  TROPIPOC 17.90*    BNPNo results for input(s): BNP, PROBNP in the last 168 hours.  DDimer No results for input(s): DDIMER in the last 168 hours.  Radiology/Studies:  Dg Chest 2 View  Result Date: 11/15/2018 CLINICAL DATA:  Chest pain EXAM: CHEST - 2 VIEW COMPARISON:  05/25/2009 FINDINGS: The heart size and mediastinal contours are within normal limits. Both lungs are  clear. Degenerative changes of the spine. Scattered fluid levels in the upper abdomen. IMPRESSION: No active cardiopulmonary disease. Electronically Signed   By: Jasmine PangKim  Fujinaga M.D.   On: 11/15/2018 15:38    Assessment and Plan:   ST segment elevation myocardial infarction resolved on repeat EKG with ongoing chest pain -Troponin 17, original EKG shows 1 to 2 mm ST segment elevation inferiorly with biphasic T wave in V3.  On repeat EKG  his ST segment elevation in the inferior leads are fairly isoelectric.  He still is having ongoing chest discomfort. -Based upon his clinical presentation, EKG findings, troponin of 17, we will proceed with urgent cardiac catheterization.  Risks and benefits have been explained to the patient including stroke heart attack death renal impairment bleeding.  He is willing to proceed.  Radial artery excellent. - Chest pain began yesterday afternoon 2 PM and was intermittent throughout the day, severe earlier this morning at work. -Cardiac risk factors include hypertension and diabetes.  Non-smoker.  No illicit drug use. - We will go ahead and aggressively treat his hypertension, beta-blocker.  He has been taking atorvastatin 10 mg.  We will increase to high intensity dose.  Ejection fraction.   Diabetes with hypertension -On lisinopril at home.  Also on metformin and glimepiride. -Check hemoglobin A1c.     Severity of Illness: The appropriate patient status for this patient is INPATIENT. Inpatient status is judged to be reasonable and necessary in order to provide the required intensity of service to ensure the patient's safety. The patient's presenting symptoms, physical exam findings, and initial radiographic and laboratory data in the context of their chronic comorbidities is felt to place them at high risk for further clinical deterioration. Furthermore, it is not anticipated that the patient will be medically stable for discharge from the hospital within 2  midnights of admission. The following factors support the patient status of inpatient.   " The patient's presenting symptoms include myocardial infarction. " The worrisome physical exam findings include chest pain. " The initial radiographic and laboratory data are worrisome because of ST segment elevation EKG. " The chronic co-morbidities include diabetes hypertension.   * I certify that at the point of admission it is my clinical judgment that the patient will require inpatient hospital care spanning beyond 2 midnights from the point of admission due to high intensity of service, high risk for further deterioration and high frequency of surveillance required.*    For questions or updates, please contact [facility] Please consult [email or web address] for contact info under   Critical care time 40 minutes spent with patient, discussion of further cardiac testing, discussion with ER care team and review of extensive data in this gentleman currently undergoing myocardial infarction, life-threatening illness with ongoing chest discomfort.   Signed, Donato Schultz, MD  11/15/2018 5:35 PM

## 2018-11-15 NOTE — Interval H&P Note (Signed)
Cath Lab Visit (complete for each Cath Lab visit)  Clinical Evaluation Leading to the Procedure:   ACS: Yes.    Non-ACS:    Anginal Classification: CCS IV  Anti-ischemic medical therapy: No Therapy  Non-Invasive Test Results: No non-invasive testing performed  Prior CABG: No previous CABG      History and Physical Interval Note:  11/15/2018 6:00 PM  Philip [name]  has presented today for surgery, with the diagnosis of urgent  The various methods of treatment have been discussed with the patient and family. After consideration of risks, benefits and other options for treatment, the patient has consented to  Procedure(s): LEFT HEART CATH AND CORONARY ANGIOGRAPHY (N/A) as a surgical intervention .  The patient's history has been reviewed, patient examined, no change in status, stable for surgery.  I have reviewed the patient's chart and labs.  Questions were answered to the patient's satisfaction.     Nicki Guadalajarahomas Shireen Rayburn

## 2018-11-15 NOTE — ED Triage Notes (Signed)
Pt presents for evaluation of central chest pain since last night. Pt reports pain is improved, was at PCP this morning and evaluated by EMS. States has had some increased belching. Pt took antacid today that he thinks helped pain.

## 2018-11-16 ENCOUNTER — Inpatient Hospital Stay (HOSPITAL_COMMUNITY): Payer: BLUE CROSS/BLUE SHIELD

## 2018-11-16 ENCOUNTER — Encounter (HOSPITAL_COMMUNITY): Payer: Self-pay | Admitting: Cardiovascular Disease

## 2018-11-16 ENCOUNTER — Other Ambulatory Visit: Payer: Self-pay | Admitting: *Deleted

## 2018-11-16 DIAGNOSIS — I213 ST elevation (STEMI) myocardial infarction of unspecified site: Secondary | ICD-10-CM

## 2018-11-16 DIAGNOSIS — I25110 Atherosclerotic heart disease of native coronary artery with unstable angina pectoris: Secondary | ICD-10-CM

## 2018-11-16 DIAGNOSIS — E119 Type 2 diabetes mellitus without complications: Secondary | ICD-10-CM

## 2018-11-16 DIAGNOSIS — R079 Chest pain, unspecified: Secondary | ICD-10-CM

## 2018-11-16 DIAGNOSIS — I10 Essential (primary) hypertension: Secondary | ICD-10-CM

## 2018-11-16 DIAGNOSIS — I2121 ST elevation (STEMI) myocardial infarction involving left circumflex coronary artery: Principal | ICD-10-CM

## 2018-11-16 DIAGNOSIS — Z01810 Encounter for preprocedural cardiovascular examination: Secondary | ICD-10-CM

## 2018-11-16 DIAGNOSIS — I2510 Atherosclerotic heart disease of native coronary artery without angina pectoris: Secondary | ICD-10-CM

## 2018-11-16 LAB — CBC
HCT: 36.1 % — ABNORMAL LOW (ref 39.0–52.0)
Hemoglobin: 12.5 g/dL — ABNORMAL LOW (ref 13.0–17.0)
MCH: 30.7 pg (ref 26.0–34.0)
MCHC: 34.6 g/dL (ref 30.0–36.0)
MCV: 88.7 fL (ref 80.0–100.0)
NRBC: 0 % (ref 0.0–0.2)
Platelets: 168 10*3/uL (ref 150–400)
RBC: 4.07 MIL/uL — AB (ref 4.22–5.81)
RDW: 11.6 % (ref 11.5–15.5)
WBC: 10.7 10*3/uL — ABNORMAL HIGH (ref 4.0–10.5)

## 2018-11-16 LAB — LIPID PANEL
CHOL/HDL RATIO: 3.7 ratio
Cholesterol: 95 mg/dL (ref 0–200)
HDL: 26 mg/dL — ABNORMAL LOW (ref >40)
LDL Cholesterol: 54 mg/dL (ref 0–99)
Triglycerides: 75 mg/dL (ref <150)
VLDL: 15 mg/dL (ref 0–40)

## 2018-11-16 LAB — BASIC METABOLIC PANEL
ANION GAP: 6 (ref 5–15)
BUN: 8 mg/dL (ref 8–23)
CALCIUM: 8.9 mg/dL (ref 8.9–10.3)
CO2: 24 mmol/L (ref 22–32)
Chloride: 102 mmol/L (ref 98–111)
Creatinine, Ser: 0.75 mg/dL (ref 0.61–1.24)
GFR calc Af Amer: >60 mL/min (ref >60)
GLUCOSE: 226 mg/dL — AB (ref 70–99)
POTASSIUM: 3.7 mmol/L (ref 3.5–5.1)
SODIUM: 132 mmol/L — AB (ref 135–145)

## 2018-11-16 LAB — PULMONARY FUNCTION TEST
FEF 25-75 Pre: 2.45 L/sec
FEF2575-%Pred-Pre: 85 %
FEV1-%Pred-Pre: 77 %
FEV1-Pre: 2.78 L
FEV1FVC-%Pred-Pre: 101 %
FEV6-%Pred-Pre: 79 %
FEV6-Pre: 3.63 L
FEV6FVC-%Pred-Pre: 103 %
FVC-%Pred-Pre: 76 %
FVC-Pre: 3.68 L
Pre FEV1/FVC ratio: 76 %
Pre FEV6/FVC Ratio: 99 %

## 2018-11-16 LAB — ECHOCARDIOGRAM COMPLETE
HEIGHTINCHES: 71 in
WEIGHTICAEL: 2848 [oz_av]

## 2018-11-16 LAB — HEPATIC FUNCTION PANEL
ALT: 44 U/L (ref 0–44)
AST: 225 U/L — ABNORMAL HIGH (ref 15–41)
Albumin: 3.4 g/dL — ABNORMAL LOW (ref 3.5–5.0)
Alkaline Phosphatase: 80 U/L (ref 38–126)
Bilirubin, Direct: 0.2 mg/dL (ref 0.0–0.2)
Indirect Bilirubin: 0.8 mg/dL (ref 0.3–0.9)
TOTAL PROTEIN: 6.4 g/dL — AB (ref 6.5–8.1)
Total Bilirubin: 1 mg/dL (ref 0.3–1.2)

## 2018-11-16 LAB — HEMOGLOBIN A1C
Hgb A1c MFr Bld: 7.8 % — ABNORMAL HIGH (ref 4.8–5.6)
MEAN PLASMA GLUCOSE: 177.16 mg/dL

## 2018-11-16 LAB — GLUCOSE, CAPILLARY
GLUCOSE-CAPILLARY: 205 mg/dL — AB (ref 70–99)
GLUCOSE-CAPILLARY: 192 mg/dL — AB (ref 70–99)
Glucose-Capillary: 127 mg/dL — ABNORMAL HIGH (ref 70–99)
Glucose-Capillary: 228 mg/dL — ABNORMAL HIGH (ref 70–99)

## 2018-11-16 LAB — TROPONIN I: Troponin I: 34.34 ng/mL (ref <0.03)

## 2018-11-16 MED ORDER — HEPARIN (PORCINE) 25000 UT/250ML-% IV SOLN
1450.0000 [IU]/h | INTRAVENOUS | Status: DC
  Administered 2018-11-16: 1000 [IU]/h via INTRAVENOUS
  Administered 2018-11-17: 1300 [IU]/h via INTRAVENOUS
  Filled 2018-11-16 (×2): qty 250

## 2018-11-16 MED ORDER — INSULIN DETEMIR 100 UNIT/ML ~~LOC~~ SOLN
14.0000 [IU] | Freq: Every day | SUBCUTANEOUS | Status: DC
  Administered 2018-11-16 – 2018-11-17 (×2): 14 [IU] via SUBCUTANEOUS
  Filled 2018-11-16 (×3): qty 0.14

## 2018-11-16 MED ORDER — PNEUMOCOCCAL VAC POLYVALENT 25 MCG/0.5ML IJ INJ: 0.5000 mL | INJECTION | INTRAMUSCULAR | Status: DC

## 2018-11-16 NOTE — Progress Notes (Signed)
Responded to PIV consult. Procedure in progress for the next 30-40 minutes. VAS Team to return when this is completed.

## 2018-11-16 NOTE — Consult Note (Addendum)
[address]       [place] [id number]             [phone]        Philip [name] [facility] Medical Record #[id number] Date of Birth: Nov 04, 1956  Referring: No ref. provider found Primary Care: Patient, No Pcp Per Primary Cardiologist:No primary care provider on file.  Chief Complaint:    Chief Complaint  Patient presents with  . Chest Pain    History of Present Illness:     Philip [name] is a 62 year old male patient with a past medical history significant for diabetes mellitus type II and hypertension.  He is a non-smoker with no family history of coronary artery disease.  He began having significant chest discomfort on 11/14/2018 around 2 PM which was originally intermittent.  He then went to sleep and woke up at 3:30 AM feeling poor.  He then went to work and at 5:30 AM was having much more significant chest discomfort and pressure with no radiation.  His supervisor encouraged him to go see a physician and he went to urgent care who then transferred him to the emergency room.  He denies headache or vision change, fevers, cough, shortness of breath, and abdominal pain.  Initial EKG demonstrated 1 to 2 mm of ST segment elevation in inferior leads.  His troponin was 17.9.  Peak troponin was greater than 65.  He continued to have chest pain while in the emergency department and his blood pressure was elevated.  Cardiology was consulted at this time.  Yesterday, he underwent cardiac catheterization which revealed severe multivessel coronary artery disease.  He had a low normal LV function with an EF of 50 to 55%.  IV nitroglycerin was initiated as well as low-dose beta-blocker therapy.  Aggrastat was also initiated and once given for a minimum of 18 hours will be switched to heparin until coronary artery bypass grafting revascularization can take place.  Due to multivessel disease we were consulted for surgical revascularization.   Current Activity/ Functional  Status: Patient was independent with mobility/ambulation, transfers, ADL's, IADL's.   Zubrod Score: At the time of surgery this patient's most appropriate activity status/level should be described as: []     0    Normal activity, no symptoms [x]     1    Restricted in physical strenuous activity but ambulatory, able to do out light work []     2    Ambulatory and capable of self care, unable to do work activities, up and about                 more than 50%  Of the time                            []     3    Only limited self care, in bed greater than 50% of waking hours []     4    Completely disabled, no self care, confined to bed or chair []     5    Moribund  Past Medical History:  Diagnosis Date  . Diabetes mellitus without complication (HCC)   . Hyperlipidemia   . Hypertension     Past Surgical History:  Procedure Laterality Date  . LEFT HEART CATH AND CORONARY ANGIOGRAPHY N/A 11/15/2018   Procedure: LEFT HEART CATH AND CORONARY ANGIOGRAPHY;  Surgeon: Lennette Bihari, MD;  Location: [facility] INVASIVE [facility];  Service: Cardiovascular;  Laterality: N/A;    Social History   Tobacco Use  Smoking Status Not on file    Social History   Substance and Sexual Activity  Alcohol Use Not on file     No Known Allergies  Current Facility-Administered Medications  Medication Dose Route Frequency Provider Last Rate Last Dose  . 0.9 %  sodium chloride infusion  250 mL Intravenous PRN Lennette Bihari, MD      . acetaminophen (TYLENOL) tablet 650 mg  650 mg Oral Q4H PRN Lennette Bihari, MD      . aspirin chewable tablet 81 mg  81 mg Oral Daily Lennette Bihari, MD   81 mg at 11/16/18 0901  . atorvastatin (LIPITOR) tablet 80 mg  80 mg Oral q1800 Lennette Bihari, MD      . diazepam (VALIUM) tablet 5 mg  5 mg Oral Q6H PRN Lennette Bihari, MD      . insulin aspart (novoLOG) injection 0-15 Units  0-15 Units Subcutaneous TID WC Lonie Peak, MD   5 Units at 11/16/18 0706  . metoprolol tartrate  (LOPRESSOR) tablet 12.5 mg  12.5 mg Oral BID Lennette Bihari, MD   12.5 mg at 11/16/18 0901  . nitroGLYCERIN (NITROSTAT) SL tablet 0.4 mg  0.4 mg Sublingual Q5 min PRN Sabas Sous, MD   0.4 mg at 11/15/18 1715  . nitroGLYCERIN 50 mg in dextrose 5 % 250 mL (0.2 mg/mL) infusion  0-200 mcg/min Intravenous Titrated Lennette Bihari, MD 4.5 mL/hr at 11/16/18 0902 15 mcg/min at 11/16/18 0902  . ondansetron (ZOFRAN) injection 4 mg  4 mg Intravenous Q6H PRN Lennette Bihari, MD      . Melene Muller ON 11/17/2018] pneumococcal 23 valent vaccine (PNU-IMMUNE) injection 0.5 mL  0.5 mL Intramuscular Tomorrow-1000 Skains, Mark C, MD      . sodium chloride flush (NS) 0.9 % injection 3 mL  3 mL Intravenous Q12H Lennette Bihari, MD   3 mL at 11/16/18 0901  . sodium chloride flush (NS) 0.9 % injection 3 mL  3 mL Intravenous PRN Lennette Bihari, MD      . tirofiban (AGGRASTAT) infusion 50 mcg/mL 100 mL  0.15 mcg/kg/min Intravenous Continuous Donato Schultz C, MD 14.58 mL/hr at 11/16/18 0700 0.15 mcg/kg/min at 11/16/18 0700    No medications prior to admission.    No family history on file.   Review of Systems:   Review of Systems  Constitutional: Negative for fever and malaise/fatigue.  Respiratory: Negative for cough and shortness of breath.   Cardiovascular: Positive for chest pain. Negative for leg swelling.  Gastrointestinal: Negative for abdominal pain.  Neurological: Negative for headaches.   Pertinent items are noted in HPI.     Physical Exam: BP 99/70   Pulse 93   Temp 99.2 F (37.3 C) (Oral)   Resp 14   Ht 5\' 11"  (1.803 m)   Wt 80.7 kg Comment: per patient  SpO2 98%   BMI 24.83 kg/m    General appearance: alert, cooperative and no distress Resp: clear to auscultation bilaterally Cardio: regular rate and rhythm, S1, S2 normal, no murmur, click, rub or gallop GI: soft, non-tender; bowel sounds normal; no masses,  no organomegaly Extremities: extremities normal, atraumatic, no cyanosis or  edema Neurologic: Grossly normal  Diagnostic Studies & Laboratory data:  Echocardiogram pending  Cardiac Cath 11/15/2018   Dist RCA lesion is 60% stenosed.  Ost RPDA lesion is 60% stenosed.  1st  RPLB lesion is 90% stenosed.  2nd RPLB lesion is 85% stenosed.  Ost 1st Diag lesion is 95% stenosed.  1st Diag lesion is 60% stenosed.  Mid LAD lesion is 70% stenosed.  Prox Cx lesion is 100% stenosed.  Ost 2nd Mrg lesion is 70% stenosed.  Post intervention, there is a 40% residual stenosis.  The left ventricular ejection fraction is 50-55% by visual estimate.  LV end diastolic pressure is low.   Severe multivessel CAD with a large twin like LAD/diagonal system with high-grade 95% stenosis at the ostium of the proximal diagonal arising from the LAD with mid narrowing of 60%, moderate irregularity of the LAD with area of ectasia in the mid segment and focal mid distal 70% stenosis; total proximal occlusion of the left circumflex coronary artery; irregular dominant RCA with area of aneurysmal ectasia in the region of the acute margin followed by 60% stenosis, and ostial narrowing of 3 major posterior branches of 60, 90 and 80%.  There is very faint collateralization of the OM branch and distal circumflex  from the distal RCA.  Low normal global LV function with an EF of 50 to 55% with subtle small region of mid anterolateral and mid posterior hypocontractility.  LVEDP 8 mm.  Successful PCI of the totally occluded circumflex vessel with PTCA alone with the 100% stenosis being reduced to 40% and TIMI 0 flow being improved to TIMI-3 flow.  There is mild residual thrombus at the site of initial occlusion.  RECOMMENDATION: Patient will be maintained on IV nitroglycerin currently at 30 mcg until CABG and will be started on low-dose beta-blocker therapy.  Bivalirudin will be continued for 4 hours post procedure and Aggrastat bolus plus infusion was initiated during the procedure and  infusion should continue for minimum of 18 hours or later.  I have contacted Dr. Dorris Fetch at the completion of the procedure who is aware of the patient.  At present the patient is hemodynamically stable.  Surgical consultation will be obtained in a.m. ECG post procedures shows small Q waves inferiorly with early transition compatible with inferior posterior MI without significant ST elevation.  Once Aggrastat is discontinued recommend heparinization until CABG revascularization. P2Y12 therapy was not administered due to need for CABG revascularization.     Recent Radiology Findings:   Dg Chest 2 View  Result Date: 11/15/2018 CLINICAL DATA:  Chest pain EXAM: CHEST - 2 VIEW COMPARISON:  05/25/2009 FINDINGS: The heart size and mediastinal contours are within normal limits. Both lungs are clear. Degenerative changes of the spine. Scattered fluid levels in the upper abdomen. IMPRESSION: No active cardiopulmonary disease. Electronically Signed   By: Jasmine Pang M.D.   On: 11/15/2018 15:38     I have independently reviewed the above radiologic studies and discussed with the patient   Recent Lab Findings: Lab Results  Component Value Date   WBC 10.7 (H) 11/16/2018   HGB 12.5 (L) 11/16/2018   HCT 36.1 (L) 11/16/2018   PLT 168 11/16/2018   GLUCOSE 226 (H) 11/16/2018   CHOL 95 11/16/2018   TRIG 75 11/16/2018   HDL 26 (L) 11/16/2018   LDLCALC 54 11/16/2018   ALT 44 11/16/2018   AST 225 (H) 11/16/2018   NA 132 (L) 11/16/2018   K 3.7 11/16/2018   CL 102 11/16/2018   CREATININE 0.75 11/16/2018   BUN 8 11/16/2018   CO2 24 11/16/2018   INR 1.1 05/19/2009   HGBA1C 7.8 (H) 11/16/2018      Assessment / Plan:  1.  STEMI-cardiac catheterization revealed multivessel coronary artery disease.  Continue IV heparin and IV nitroglycerin.  Continue medical management with aspirin, Lipitor, and Lopressor.  The patient did not receive any Plavix.  Plan for surgical revascularization on Friday  11/18/2018 with Dr. Donata ClayVan Trigt. 2.  Diabetes mellitus type 2-hemoglobin A1c is 7.8.  Metformin is on hold.  Continue insulin coverage.  3.  Hypertension-currently borderline hypotensive.  Patient is only taking low-dose beta-blocker and is on no other antihypertensive agents.  He does have PRN hydralazine ordered.   Plan: Possible surgical revascularization planned for Friday 11/18/2018. Patient would prefer to discuss with girlfriend and family before making a final decision.   I  spent 40 minutes counseling the patient face to face.   Jari Favreessa Conte, PA-C 11/16/2018 9:49 AM  Patient examined, images of coronary angiogram as well as 2D echocardiogram personally reviewed and discussed with patient.  Agree with above assessment.    62 year old diabetic presents with completed non-STEMI with total occlusion of the circumflex marginal and high-grade stenosis of the LAD circulation and posterior descending branch of the RCA..  Cardiac enzymes peaked with troponin greater than 65 and now are on the decline.  Echocardiogram shows ejection fraction of 45% without valvular disease.  The patient underwent PCTA of the circumflex and was placed on Aggrastat.  He has remained hemodynamically stable and pain-free.  He has been recommended for surgical coronary  revascularization.  I have discussed the procedure of CABG with the patient and his family and he understands the expected benefits of preservation of LV function and improve survival.  He understands the risks of bleeding, stroke, infection, organ failure, death.  We will stop Aggrastat later today, continue heparin in preparation for CABG tomorrow.

## 2018-11-16 NOTE — Progress Notes (Addendum)
Progress Note  Patient Name: Philip [name] Date of Encounter: 11/16/2018  Primary Cardiologist: No primary care provider on file. New  Subjective   Currently without chest pain.  Quiet.  No shortness of breath.  Inpatient Medications    Scheduled Meds: . aspirin  81 mg Oral Daily  . atorvastatin  80 mg Oral q1800  . insulin aspart  0-15 Units Subcutaneous TID WC  . metoprolol tartrate  12.5 mg Oral BID  . [START ON 11/17/2018] pneumococcal 23 valent vaccine  0.5 mL Intramuscular Tomorrow-1000  . sodium chloride flush  3 mL Intravenous Q12H   Continuous Infusions: . sodium chloride    . nitroGLYCERIN 30 mcg/min (11/16/18 0700)  . tirofiban 0.15 mcg/kg/min (11/16/18 0700)   PRN Meds: sodium chloride, acetaminophen, diazepam, nitroGLYCERIN, ondansetron (ZOFRAN) IV, sodium chloride flush   Vital Signs    Vitals:   11/16/18 0700 11/16/18 0800 11/16/18 0815 11/16/18 0830  BP: 128/61  108/66 99/70  Pulse: (!) 101 95 92 93  Resp: (!) 26 15 13 14   Temp: 99.2 F (37.3 C)     TempSrc: Oral     SpO2: 97% 98% 98% 98%  Weight:      Height:        Intake/Output Summary (Last 24 hours) at 11/16/2018 0845 Last data filed at 11/16/2018 0800 Gross per 24 hour  Intake 1149.52 ml  Output 850 ml  Net 299.52 ml   Filed Weights   11/15/18 1700  Weight: 80.7 kg    Telemetry    Sinus rhythm, no ventricular tachycardia- Personally Reviewed  ECG    Inferior ST segment elevation had resolved.- Personally Reviewed  Physical Exam   GEN: No acute distress.   Neck: No JVD Cardiac: RRR, no murmurs, rubs, or gallops.  Respiratory: Clear to auscultation bilaterally. GI: Soft, nontender, non-distended  MS: No edema; No deformity.  Catheterization site normal. Neuro:  Nonfocal  Psych: Normal affect   Labs    Chemistry Recent Labs  Lab 11/15/18 1545 11/16/18 0037  NA 135 132*  K 3.7 3.7  CL 99 102  CO2 27 24  GLUCOSE 272* 226*  BUN 8 8  CREATININE 0.93 0.75    CALCIUM 10.5* 8.9  PROT  --  6.4*  ALBUMIN  --  3.4*  AST  --  225*  ALT  --  44  ALKPHOS  --  80  BILITOT  --  1.0  GFRNONAA >60 >60  GFRAA >60 >60  ANIONGAP 9 6     Hematology Recent Labs  Lab 11/15/18 1545 11/16/18 0037  WBC 9.8 10.7*  RBC 4.93 4.07*  HGB 14.9 12.5*  HCT 44.9 36.1*  MCV 91.1 88.7  MCH 30.2 30.7  MCHC 33.2 34.6  RDW 11.8 11.6  PLT 209 168    Cardiac Enzymes Recent Labs  Lab 11/15/18 2020  TROPONINI >65.00*    Recent Labs  Lab 11/15/18 1618  TROPIPOC 17.90*     BNPNo results for input(s): BNP, PROBNP in the last 168 hours.   DDimer No results for input(s): DDIMER in the last 168 hours.   Radiology    Dg Chest 2 View  Result Date: 11/15/2018 CLINICAL DATA:  Chest pain EXAM: CHEST - 2 VIEW COMPARISON:  05/25/2009 FINDINGS: The heart size and mediastinal contours are within normal limits. Both lungs are clear. Degenerative changes of the spine. Scattered fluid levels in the upper abdomen. IMPRESSION: No active cardiopulmonary disease. Electronically Signed   By: Jasmine PangKim  Fujinaga  M.D.   On: 11/15/2018 15:38    Cardiac Studies    Severe multivessel CAD with a large twin like LAD/diagonal system with high-grade 95% stenosis at the ostium of the proximal diagonal arising from the LAD with mid narrowing of 60%, moderate irregularity of the LAD with area of ectasia in the mid segment and focal mid distal 70% stenosis; total proximal occlusion of the left circumflex coronary artery; irregular dominant RCA with area of aneurysmal ectasia in the region of the acute margin followed by 60% stenosis, and ostial narrowing of 3 major posterior branches of 60, 90 and 80%.  There is very faint collateralization of the OM branch and distal circumflex  from the distal RCA.  Low normal global LV function with an EF of 50 to 55% with subtle small region of mid anterolateral and mid posterior hypocontractility.  LVEDP 8 mm.  Successful PCI of the totally occluded  circumflex vessel with PTCA alone with the 100% stenosis being reduced to 40% and TIMI 0 flow being improved to TIMI-3 flow.  There is mild residual thrombus at the site of initial occlusion.  RECOMMENDATION: Patient will be maintained on IV nitroglycerin currently at 30 mcg until CABG and will be started on low-dose beta-blocker therapy.  Bivalirudin will be continued for 4 hours post procedure and Aggrastat bolus plus infusion was initiated during the procedure and infusion should continue for minimum of 18 hours or later.  I have contacted Dr. Dorris Fetch at the completion of the procedure who is aware of the patient.  At present the patient is hemodynamically stable.  Surgical consultation will be obtained in a.m. ECG post procedures shows small Q waves inferiorly with early transition compatible with inferior posterior MI without significant ST elevation.  Once Aggrastat is discontinued recommend heparinization until CABG revascularization. P2Y12 therapy was not administered due to need for CABG revascularization.  Diagnostic Diagram       Post-Intervention Diagram          Patient Profile     62 y.o. male with multivessel coronary artery disease status post inferior ST elevation myocardial infarction, over 24 hours of chest pain, troponin XVII on presentation, circumflex balloon angioplasty, fairly preserved EF, diabetic, non-smoker  Assessment & Plan    Inferior ST elevation myocardial infarction - Cardiac catheterization in detail as above.  Ultimately circumflex balloon angioplasty was performed only.  Aggrastat, heparin drip.  IV nitroglycerin.  Troponin XVII on presentation, currently greater than 65.  EF relatively preserved.  Awaiting echocardiogram.  Dr. Dorris Fetch was notified last night and cardiothoracic surgery will be consulting this morning. -Continue with aggressive secondary risk factor prevention with high intensity statin therapy.  Beta-blocker.  Lisinopril as  tolerated. - Given his multivessel disease, large double barrel LAD, I think he would benefit long-term from CABG.  -Hemoglobin A1c has been added onto labs as well as lipid panel.  Diabetes with hypertension -Currently stable.  Sliding scale insulin on board.  Holding metformin given recent contrast load.  Donato Schultz, MD   For questions or updates, please contact [facility] Please consult [email or web address] for contact info under        Signed, Donato Schultz, MD  11/16/2018, 8:45 AM

## 2018-11-16 NOTE — Progress Notes (Signed)
CARDIAC REHAB PHASE I   Offered to do pre-op ed with pt and SO. Both declining at this time stating they need more information before they can make a decision on whether to proceed with surgery or not. Will follow-up as able after a decision has been made.   Reynold Boweneresa  Traven Davids, RN BSN 11/16/2018 2:45 PM

## 2018-11-16 NOTE — Progress Notes (Signed)
ANTICOAGULATION CONSULT NOTE - Follow up Consult  Pharmacy Consult for Tirofiban >  Heparin  Indication: chest pain/ACS  No Known Allergies  Patient Measurements: Height: 5\' 11"  (180.3 cm) Weight: 178 lb (80.7 kg)(per patient) IBW/kg (Calculated) : 75.3   Vital Signs: Temp: 98.8 F (37.1 C) (11/27 1100) Temp Source: Oral (11/27 1100) BP: 127/79 (11/27 1300) Pulse Rate: 83 (11/27 1300)  Labs: Recent Labs    11/15/18 1545 11/15/18 2020 11/16/18 0037  HGB 14.9  --  12.5*  HCT 44.9  --  36.1*  PLT 209  --  168  CREATININE 0.93  --  0.75  TROPONINI  --  >65.00*  --     Estimated Creatinine Clearance: 99.4 mL/min (by C-G formula based on SCr of 0.75 mg/dL).   Medical History: Past Medical History:  Diagnosis Date  . Diabetes mellitus without complication (HCC)   . Hyperlipidemia   . Hypertension      Assessment: 62yom s/p cath with balloon angioplasty to LCx and multivessel CAD > planned CABG.  S/p tirofiban drip 18hr post angioplasty and will start heparin drip until OR CBC stable meds ok ASA, statin, BB  Goal of Therapy:  Heparin level 0.3-0.7 units/ml Monitor platelets by anticoagulation protocol: Yes   Plan:  Stop tirofiban at 3pm  Start heparin drip 1000 uts/hr  Heparin level, CBC daily  Leota SauersLisa Nahome Bublitz Pharm.D. CPP, BCPS Clinical Pharmacist [phone] 11/16/2018 2:01 PM

## 2018-11-16 NOTE — Progress Notes (Signed)
  Echocardiogram 2D Echocardiogram has been performed.  Philip [name] 11/16/2018, 11:10 AM

## 2018-11-16 NOTE — Progress Notes (Signed)
Pre-op Cardiac Surgery  Carotid Findings:  Right WNL Left -1% to 39% ICA stenosis. Bilateral vertebral artery flow is antegrade   Upper Extremity Right Left  Brachial Pressures 137 Triphasic 134 Triphasic  Radial Waveforms Triphasic Triphasic  Ulnar Waveforms Triphasic Triphasic  Palmar Arch (Allen's Test) Normal Normal   Findings: palmar arch evaluation - Doppler waveforms remained normal bilaterally with both radial and ulnar compressions     Lower  Extremity Right Left  Dorsalis Pedis 172 Triphasic 195 Triphasic  Posterior Tibial 199 Triphasic 192 Triphasic  Great toe 127 127  Ankle/Brachial Indices/ Toe Brachial indices 1.45/0.93 1.42 / 0.93    Findings:  ABIs bilaterally indicate a an elevation due to possible calcification of the arteries. TBIs are normal bilaterally. Philip [name],RVS 11/16/2018, 7:17 PM

## 2018-11-17 LAB — HEPARIN LEVEL (UNFRACTIONATED)
HEPARIN UNFRACTIONATED: 0.24 [IU]/mL — AB (ref 0.30–0.70)
Heparin Unfractionated: 0.12 IU/mL — ABNORMAL LOW (ref 0.30–0.70)
Heparin Unfractionated: 0.25 IU/mL — ABNORMAL LOW (ref 0.30–0.70)

## 2018-11-17 LAB — COMPREHENSIVE METABOLIC PANEL
ALT: 28 U/L (ref 0–44)
AST: 63 U/L — ABNORMAL HIGH (ref 15–41)
Albumin: 3.2 g/dL — ABNORMAL LOW (ref 3.5–5.0)
Alkaline Phosphatase: 69 U/L (ref 38–126)
Anion gap: 10 (ref 5–15)
BUN: 9 mg/dL (ref 8–23)
CO2: 22 mmol/L (ref 22–32)
Calcium: 8.5 mg/dL — ABNORMAL LOW (ref 8.9–10.3)
Chloride: 103 mmol/L (ref 98–111)
Creatinine, Ser: 0.81 mg/dL (ref 0.61–1.24)
GFR calc Af Amer: >60 mL/min (ref >60)
GFR calc non Af Amer: >60 mL/min (ref >60)
Glucose, Bld: 164 mg/dL — ABNORMAL HIGH (ref 70–99)
Potassium: 3.7 mmol/L (ref 3.5–5.1)
Sodium: 135 mmol/L (ref 135–145)
Total Bilirubin: 1.2 mg/dL (ref 0.3–1.2)
Total Protein: 6.3 g/dL — ABNORMAL LOW (ref 6.5–8.1)

## 2018-11-17 LAB — PREPARE RBC (CROSSMATCH)

## 2018-11-17 LAB — HEMOGLOBIN A1C
Hgb A1c MFr Bld: 7.6 % — ABNORMAL HIGH (ref 4.8–5.6)
Mean Plasma Glucose: 171.42 mg/dL

## 2018-11-17 LAB — GLUCOSE, CAPILLARY
GLUCOSE-CAPILLARY: 161 mg/dL — AB (ref 70–99)
Glucose-Capillary: 171 mg/dL — ABNORMAL HIGH (ref 70–99)
Glucose-Capillary: 249 mg/dL — ABNORMAL HIGH (ref 70–99)
Glucose-Capillary: 177 mg/dL — ABNORMAL HIGH (ref 70–99)

## 2018-11-17 LAB — POCT I-STAT 3, ART BLOOD GAS (G3+)
Bicarbonate: 24 mmol/L (ref 20.0–28.0)
O2 Saturation: 98 %
TCO2: 25 mmol/L (ref 22–32)
pCO2 arterial: 34.1 mmHg (ref 32.0–48.0)
pH, Arterial: 7.455 — ABNORMAL HIGH (ref 7.350–7.450)
pO2, Arterial: 97 mmHg (ref 83.0–108.0)

## 2018-11-17 LAB — TSH: TSH: 1.74 u[IU]/mL (ref 0.350–4.500)

## 2018-11-17 LAB — URINALYSIS, ROUTINE W REFLEX MICROSCOPIC
Bilirubin Urine: NEGATIVE
Glucose, UA: NEGATIVE mg/dL
Hgb urine dipstick: NEGATIVE
Ketones, ur: NEGATIVE mg/dL
Leukocytes, UA: NEGATIVE
Nitrite: NEGATIVE
Protein, ur: NEGATIVE mg/dL
Specific Gravity, Urine: 1.006 (ref 1.005–1.030)
pH: 7 (ref 5.0–8.0)

## 2018-11-17 LAB — CBC
HEMATOCRIT: 36 % — AB (ref 39.0–52.0)
Hemoglobin: 12.5 g/dL — ABNORMAL LOW (ref 13.0–17.0)
MCH: 30.8 pg (ref 26.0–34.0)
MCHC: 34.7 g/dL (ref 30.0–36.0)
MCV: 88.7 fL (ref 80.0–100.0)
NRBC: 0 % (ref 0.0–0.2)
PLATELETS: 158 10*3/uL (ref 150–400)
RBC: 4.06 MIL/uL — AB (ref 4.22–5.81)
RDW: 11.5 % (ref 11.5–15.5)
WBC: 9.6 10*3/uL (ref 4.0–10.5)

## 2018-11-17 LAB — PROTIME-INR
INR: 1.13
Prothrombin Time: 14.4 seconds (ref 11.4–15.2)

## 2018-11-17 LAB — TROPONIN I: Troponin I: 23.79 ng/mL (ref <0.03)

## 2018-11-17 MED ORDER — CHLORHEXIDINE GLUCONATE 4 % EX LIQD
60.0000 mL | Freq: Once | CUTANEOUS | Status: AC
  Administered 2018-11-18: 4 via TOPICAL
  Filled 2018-11-17: qty 60

## 2018-11-17 MED ORDER — CHLORHEXIDINE GLUCONATE 4 % EX LIQD
60.0000 mL | Freq: Once | CUTANEOUS | Status: AC
  Administered 2018-11-17: 4 via TOPICAL
  Filled 2018-11-17: qty 15

## 2018-11-17 MED ORDER — INSULIN REGULAR(HUMAN) IN NACL 100-0.9 UT/100ML-% IV SOLN
INTRAVENOUS | Status: AC
  Administered 2018-11-18: 1.3 [IU]/h via INTRAVENOUS
  Filled 2018-11-17: qty 100

## 2018-11-17 MED ORDER — TRANEXAMIC ACID (OHS) BOLUS VIA INFUSION
15.0000 mg/kg | INTRAVENOUS | Status: AC
  Administered 2018-11-18: 1210.5 mg via INTRAVENOUS
  Filled 2018-11-17: qty 1211

## 2018-11-17 MED ORDER — TRANEXAMIC ACID 1000 MG/10ML IV SOLN
1.5000 mg/kg/h | INTRAVENOUS | Status: AC
  Administered 2018-11-18: 1.5 mg/kg/h via INTRAVENOUS
  Filled 2018-11-17: qty 25

## 2018-11-17 MED ORDER — POTASSIUM CHLORIDE 2 MEQ/ML IV SOLN
80.0000 meq | INTRAVENOUS | Status: DC
  Filled 2018-11-17 (×2): qty 40

## 2018-11-17 MED ORDER — TEMAZEPAM 15 MG PO CAPS: 15.0000 mg | ORAL_CAPSULE | Freq: Once | ORAL | Status: DC | PRN

## 2018-11-17 MED ORDER — METOPROLOL TARTRATE 12.5 MG HALF TABLET
12.5000 mg | ORAL_TABLET | Freq: Once | ORAL | Status: AC
  Administered 2018-11-18: 12.5 mg via ORAL
  Filled 2018-11-17: qty 1

## 2018-11-17 MED ORDER — DIAZEPAM 5 MG PO TABS
5.0000 mg | ORAL_TABLET | Freq: Once | ORAL | Status: AC
  Administered 2018-11-18: 5 mg via ORAL
  Filled 2018-11-17: qty 1

## 2018-11-17 MED ORDER — PLASMA-LYTE 148 IV SOLN
INTRAVENOUS | Status: AC
  Administered 2018-11-18: 500 mL
  Filled 2018-11-17: qty 2.5

## 2018-11-17 MED ORDER — PNEUMOCOCCAL VAC POLYVALENT 25 MCG/0.5ML IJ INJ: 0.5000 mL | INJECTION | INTRAMUSCULAR | Status: DC | PRN

## 2018-11-17 MED ORDER — PHENYLEPHRINE HCL-NACL 20-0.9 MG/250ML-% IV SOLN
30.0000 ug/min | INTRAVENOUS | Status: AC
  Administered 2018-11-18: 20 ug/min via INTRAVENOUS
  Filled 2018-11-17: qty 250

## 2018-11-17 MED ORDER — NITROGLYCERIN IN D5W 200-5 MCG/ML-% IV SOLN
2.0000 ug/min | INTRAVENOUS | Status: DC
  Filled 2018-11-17: qty 250

## 2018-11-17 MED ORDER — BISACODYL 5 MG PO TBEC: 5.0000 mg | DELAYED_RELEASE_TABLET | Freq: Once | ORAL | Status: DC

## 2018-11-17 MED ORDER — MAGNESIUM SULFATE 50 % IJ SOLN
40.0000 meq | INTRAMUSCULAR | Status: DC
  Filled 2018-11-17 (×2): qty 9.85

## 2018-11-17 MED ORDER — EPINEPHRINE PF 1 MG/ML IJ SOLN
0.0000 ug/min | INTRAVENOUS | Status: DC
  Filled 2018-11-17: qty 4

## 2018-11-17 MED ORDER — SODIUM CHLORIDE 0.9 % IV SOLN
INTRAVENOUS | Status: DC
  Filled 2018-11-17: qty 30

## 2018-11-17 MED ORDER — DEXMEDETOMIDINE HCL IN NACL 400 MCG/100ML IV SOLN
0.1000 ug/kg/h | INTRAVENOUS | Status: AC
  Administered 2018-11-18: .5 ug/kg/h via INTRAVENOUS
  Filled 2018-11-17: qty 100

## 2018-11-17 MED ORDER — VANCOMYCIN HCL 10 G IV SOLR
1250.0000 mg | INTRAVENOUS | Status: AC
  Administered 2018-11-18: 1250 mg via INTRAVENOUS
  Filled 2018-11-17: qty 1250

## 2018-11-17 MED ORDER — CHLORHEXIDINE GLUCONATE 0.12 % MT SOLN: 15.0000 mL | Freq: Once | OROMUCOSAL | Status: DC

## 2018-11-17 MED ORDER — TRANEXAMIC ACID (OHS) PUMP PRIME SOLUTION
2.0000 mg/kg | INTRAVENOUS | Status: DC
  Filled 2018-11-17: qty 1.61

## 2018-11-17 MED ORDER — DOPAMINE-DEXTROSE 3.2-5 MG/ML-% IV SOLN
0.0000 ug/kg/min | INTRAVENOUS | Status: DC
  Administered 2018-11-18: 0 ug/kg/min via INTRAVENOUS
  Filled 2018-11-17: qty 250

## 2018-11-17 MED ORDER — SODIUM CHLORIDE 0.9 % IV SOLN
1.5000 g | INTRAVENOUS | Status: AC
  Administered 2018-11-18: 1.5 g via INTRAVENOUS
  Filled 2018-11-17 (×2): qty 1.5

## 2018-11-17 MED ORDER — SODIUM CHLORIDE 0.9 % IV SOLN
750.0000 mg | INTRAVENOUS | Status: DC
  Filled 2018-11-17 (×2): qty 750

## 2018-11-17 MED ORDER — MILRINONE LACTATE IN DEXTROSE 20-5 MG/100ML-% IV SOLN
0.3000 ug/kg/min | INTRAVENOUS | Status: AC
  Administered 2018-11-18: 0.125 ug/kg/min via INTRAVENOUS
  Filled 2018-11-17: qty 100

## 2018-11-17 NOTE — Progress Notes (Signed)
ANTICOAGULATION CONSULT NOTE - Follow up Consult  Pharmacy Consult for heparin Indication: chest pain/ACS  No Known Allergies  Patient Measurements: Height: 5\' 11"  (180.3 cm) Weight: 168 lb (76.2 kg) IBW/kg (Calculated) : 75.3 HEPARIN DW (KG): 76.2   Vital Signs: Temp: 99.1 F (37.3 C) (11/28 1622) Temp Source: Oral (11/28 1622) BP: 133/95 (11/28 1900) Pulse Rate: 91 (11/28 1900)  Labs: Recent Labs    11/15/18 1545 11/15/18 2020 11/16/18 0037 11/16/18 1646 11/17/18 0310 11/17/18 1031 11/17/18 1803  HGB 14.9  --  12.5*  --  12.5*  --   --   HCT 44.9  --  36.1*  --  36.0*  --   --   PLT 209  --  168  --  158  --   --   LABPROT  --   --   --   --  14.4  --   --   INR  --   --   --   --  1.13  --   --   HEPARINUNFRC  --   --   --   --  0.12* 0.24* 0.25*  CREATININE 0.93  --  0.75  --  0.81  --   --   TROPONINI  --  >65.00*  --  34.34* 23.79*  --   --     Estimated Creatinine Clearance: 98.1 mL/min (by C-G formula based on SCr of 0.81 mg/dL).   Medical History: Past Medical History:  Diagnosis Date  . Diabetes mellitus without complication (HCC)   . Hyperlipidemia   . Hypertension      Assessment: Philip [name] s/p cath with balloon angioplasty to LCx and multivessel CAD > planned CABG. S/p tirofiban drip 18hrs post-angioplasty and will start heparin drip until CABG tomorrow.  Heparin level still subtherapeutic at 0.25 after rate increase earlier today. IV site was changed earlier. CBC stable. No issues with infusion per discussion with RN but does state patient has had very small amount of bleeding around the IV site the last few nights - unchanged from previous. RN to notify if bleeding worsens.  Goal of Therapy:  Heparin level 0.3-0.7 units/ml Monitor platelets by anticoagulation protocol: Yes   Plan:  Increase heparin drip to 1450 units/hr 6h heparin level Monitor daily heparin level and CBC, s/sx bleeding CABG planned for tomorrow  Babs BertinHaley Demarr Kluever, PharmD,  BCPS Clinical Pharmacist Clinical phone [phone] Please check AMION for all [facility] contact numbers 11/17/2018 7:46 PM

## 2018-11-17 NOTE — Progress Notes (Signed)
ANTICOAGULATION CONSULT NOTE - Follow up Consult  Pharmacy Consult for heparin Indication: chest pain/ACS  No Known Allergies  Patient Measurements: Height: 5\' 11"  (180.3 cm) Weight: 178 lb (80.7 kg)(per patient) IBW/kg (Calculated) : 75.3   Vital Signs: Temp: 98.8 F (37.1 C) (11/28 0000) Temp Source: Oral (11/28 0000) BP: 113/74 (11/28 0300) Pulse Rate: 93 (11/28 0300)  Labs: Recent Labs    11/15/18 1545 11/15/18 2020 11/16/18 0037 11/16/18 1646 11/17/18 0310  HGB 14.9  --  12.5*  --  12.5*  HCT 44.9  --  36.1*  --  36.0*  PLT 209  --  168  --  158  LABPROT  --   --   --   --  14.4  INR  --   --   --   --  1.13  HEPARINUNFRC  --   --   --   --  0.12*  CREATININE 0.93  --  0.75  --   --   TROPONINI  --  >65.00*  --  34.34*  --     Estimated Creatinine Clearance: 99.4 mL/min (by C-G formula based on SCr of 0.75 mg/dL).   Medical History: Past Medical History:  Diagnosis Date  . Diabetes mellitus without complication (HCC)   . Hyperlipidemia   . Hypertension      Assessment: 62yom s/p cath with balloon angioplasty to LCx and multivessel CAD > planned CABG.  S/p tirofiban drip 18hr post angioplasty and will start heparin drip until OR CBC stable meds ok ASA, statin, BB  Initial heparin level 0.12 units/ml Goal of Therapy:  Heparin level 0.3-0.7 units/ml Monitor platelets by anticoagulation protocol: Yes   Plan:  Increase heparin drip to 1200 uts/hr Check heparin level ~ 6 hours Heparin level, CBC daily  Talbert CageLora Vernestine Brodhead, PharmD Clinical Pharmacist  11/17/2018 4:31 AM

## 2018-11-17 NOTE — Progress Notes (Signed)
Pre-op CABG education started with patient.  Cardiac Surgery booklet given and reviewed with patient.  RN offered educational videos and patient declined at this time as he stated he would prefer to review the cardiac surgery booklet.  Incentive Spirometer given to patient, patient educated on IS use and demonstrated understanding.

## 2018-11-17 NOTE — Progress Notes (Signed)
2 Days Post-Op Procedure(s) (LRB): LEFT HEART CATH AND CORONARY ANGIOGRAPHY (N/A) Subjective: comfortable on iv heparin Objective: Agrees to proceed with CABG tomorrow Cardiac enzymes lowering  Vital signs in last 24 hours: Temp:  [98.8 F (37.1 C)-99.3 F (37.4 C)] 99.3 F (37.4 C) (11/28 0700) Pulse Rate:  [75-97] 90 (11/28 0800) Cardiac Rhythm: Normal sinus rhythm (11/28 0400) Resp:  [14-25] 17 (11/28 0800) BP: (91-154)/(55-90) 115/72 (11/28 0800) SpO2:  [94 %-99 %] 99 % (11/28 0800)  Hemodynamic parameters for last 24 hours:  stable  Intake/Output from previous day: 11/27 0701 - 11/28 0700 In: 652.1 [P.O.:360; I.V.:292.1] Out: 2475 [Urine:2475] Intake/Output this shift: Total I/O In: 240 [P.O.:240] Out: 225 [Urine:225]       Exam    General- alert and comfortable    Neck- no JVD, no cervical adenopathy palpable, no carotid bruit   Lungs- clear without rales, wheezes   Cor- regular rate and rhythm, no murmur , gallop   Abdomen- soft, non-tender   Extremities - warm, non-tender, minimal edema   Neuro- oriented, appropriate, no focal weakness   Lab Results: Recent Labs    11/16/18 0037 11/17/18 0310  WBC 10.7* 9.6  HGB 12.5* 12.5*  HCT 36.1* 36.0*  PLT 168 158   BMET:  Recent Labs    11/16/18 0037 11/17/18 0310  NA 132* 135  K 3.7 3.7  CL 102 103  CO2 24 22  GLUCOSE 226* 164*  BUN 8 9  CREATININE 0.75 0.81  CALCIUM 8.9 8.5*    PT/INR:  Recent Labs    11/17/18 0310  LABPROT 14.4  INR 1.13   ABG    Component Value Date/Time   PHART 7.375 05/20/2009 0912   HCO3 26.1 (H) 05/20/2009 0912   TCO2 27 05/20/2009 0912   ACIDBASEDEF 7.0 (H) 05/19/2009 0850   O2SAT 92.0 05/20/2009 0912   CBG (last 3)  Recent Labs    11/16/18 1723 11/16/18 2132 11/17/18 0648  GLUCAP 228* 192* 171*    Assessment/Plan: S/P Procedure(s) (LRB): LEFT HEART CATH AND CORONARY ANGIOGRAPHY (N/A) CABG tomorrow   LOS: 2 days    Philip Nationseter Van Trigt  [name] 11/17/2018

## 2018-11-17 NOTE — Progress Notes (Signed)
ANTICOAGULATION CONSULT NOTE - Follow up Consult  Pharmacy Consult for heparin Indication: chest pain/ACS  No Known Allergies  Patient Measurements: Height: 5\' 11"  (180.3 cm) Weight: 178 lb (80.7 kg)(per patient) IBW/kg (Calculated) : 75.3 HEPARIN DW (KG): 80.7   Vital Signs: Temp: 99.3 F (37.4 C) (11/28 1113) Temp Source: Oral (11/28 1113) BP: 121/77 (11/28 1100) Pulse Rate: 76 (11/28 1100)  Labs: Recent Labs    11/15/18 1545 11/15/18 2020 11/16/18 0037 11/16/18 1646 11/17/18 0310 11/17/18 1031  HGB 14.9  --  12.5*  --  12.5*  --   HCT 44.9  --  36.1*  --  36.0*  --   PLT 209  --  168  --  158  --   LABPROT  --   --   --   --  14.4  --   INR  --   --   --   --  1.13  --   HEPARINUNFRC  --   --   --   --  0.12* 0.24*  CREATININE 0.93  --  0.75  --  0.81  --   TROPONINI  --  >65.00*  --  34.34* 23.79*  --     Estimated Creatinine Clearance: 98.1 mL/min (by C-G formula based on SCr of 0.81 mg/dL).   Medical History: Past Medical History:  Diagnosis Date  . Diabetes mellitus without complication (HCC)   . Hyperlipidemia   . Hypertension      Assessment: 62yom s/p cath with balloon angioplasty to LCx and multivessel CAD > planned CABG.  S/p tirofiban drip 18hr post angioplasty and will start heparin drip until CABG tomorrow.  Heparin level slightly subtherapeutic at 0.24 on heparin 1200 units/hr. Hb stable. No signs/symptoms of bleeding. Nurse concerned for potential leaky IV site, no signs of infiltration so believe full dose was being administered, site has been switched.    Goal of Therapy:  Heparin level 0.3-0.7 units/ml Monitor platelets by anticoagulation protocol: Yes   Plan:  Increase heparin drip to 1300 unitts/hr Check heparin level ~ 6 hours Heparin level, CBC daily   Marcelino FreestoneEmily Lashan Gluth, PharmD PGY2 Cardiology Pharmacy Resident Phone [phone] Please check AMION for all Pharmacist numbers by unit 11/17/2018 11:44 AM

## 2018-11-17 NOTE — Progress Notes (Signed)
Progress Note  Patient Name: Philip [name] Date of Encounter: 11/17/2018  Primary Cardiologist: Donato Schultz, MD   Subjective   No CP, no SOB. On Hep IV, NTG low dose IV.   Inpatient Medications    Scheduled Meds: . aspirin  81 mg Oral Daily  . atorvastatin  80 mg Oral q1800  . chlorhexidine  60 mL Topical Once   And  . [START ON 11/18/2018] chlorhexidine  60 mL Topical Once  . [START ON 11/18/2018] chlorhexidine  15 mL Mouth/Throat Once  . [START ON 11/18/2018] diazepam  5 mg Oral Once  . insulin aspart  0-15 Units Subcutaneous TID WC  . insulin detemir  14 Units Subcutaneous QHS  . metoprolol tartrate  12.5 mg Oral BID  . [START ON 11/18/2018] metoprolol tartrate  12.5 mg Oral Once  . pneumococcal 23 valent vaccine  0.5 mL Intramuscular Tomorrow-1000  . sodium chloride flush  3 mL Intravenous Q12H   Continuous Infusions: . sodium chloride    . heparin 1,200 Units/hr (11/17/18 0700)  . nitroGLYCERIN 30 mcg/min (11/17/18 0700)   PRN Meds: sodium chloride, acetaminophen, diazepam, nitroGLYCERIN, ondansetron (ZOFRAN) IV, sodium chloride flush, temazepam   Vital Signs    Vitals:   11/17/18 0600 11/17/18 0700 11/17/18 0735 11/17/18 0800  BP: 108/66 111/66 115/78 115/72  Pulse: 75 91 94 90  Resp: (!) 22 15 20 17   Temp:  99.3 F (37.4 C)    TempSrc:  Oral    SpO2: 97% 98% 95% 99%  Weight:      Height:        Intake/Output Summary (Last 24 hours) at 11/17/2018 0824 Last data filed at 11/17/2018 0731 Gross per 24 hour  Intake 627.72 ml  Output 2700 ml  Net -2072.28 ml   Filed Weights   11/15/18 1700  Weight: 80.7 kg    Telemetry    NSR 95 - Personally Reviewed  ECG    NSR - Personally Reviewed  Physical Exam   GEN: No acute distress.   Neck: No JVD Cardiac: RRR, no murmurs, rubs, or gallops.  Respiratory: Clear to auscultation bilaterally. GI: Soft, nontender, non-distended  MS: No edema; No deformity. Neuro:  Nonfocal  Psych: Normal  affect   Labs    Chemistry Recent Labs  Lab 11/15/18 1545 11/16/18 0037 11/17/18 0310  NA 135 132* 135  K 3.7 3.7 3.7  CL 99 102 103  CO2 27 24 22   GLUCOSE 272* 226* 164*  BUN 8 8 9   CREATININE 0.93 0.75 0.81  CALCIUM 10.5* 8.9 8.5*  PROT  --  6.4* 6.3*  ALBUMIN  --  3.4* 3.2*  AST  --  225* 63*  ALT  --  44 28  ALKPHOS  --  80 69  BILITOT  --  1.0 1.2  GFRNONAA >60 >60 >60  GFRAA >60 >60 >60  ANIONGAP 9 6 10      Hematology Recent Labs  Lab 11/15/18 1545 11/16/18 0037 11/17/18 0310  WBC 9.8 10.7* 9.6  RBC 4.93 4.07* 4.06*  HGB 14.9 12.5* 12.5*  HCT 44.9 36.1* 36.0*  MCV 91.1 88.7 88.7  MCH 30.2 30.7 30.8  MCHC 33.2 34.6 34.7  RDW 11.8 11.6 11.5  PLT 209 168 158    Cardiac Enzymes Recent Labs  Lab 11/15/18 2020 11/16/18 1646 11/17/18 0310  TROPONINI >65.00* 34.34* 23.79*    Recent Labs  Lab 11/15/18 1618  TROPIPOC 17.90*     BNPNo results for input(s): BNP, PROBNP  in the last 168 hours.   DDimer No results for input(s): DDIMER in the last 168 hours.   Radiology    Dg Chest 2 View  Result Date: 11/15/2018 CLINICAL DATA:  Chest pain EXAM: CHEST - 2 VIEW COMPARISON:  05/25/2009 FINDINGS: The heart size and mediastinal contours are within normal limits. Both lungs are clear. Degenerative changes of the spine. Scattered fluid levels in the upper abdomen. IMPRESSION: No active cardiopulmonary disease. Electronically Signed   By: Jasmine PangKim  Fujinaga M.D.   On: 11/15/2018 15:38    Cardiac Studies   ECHO - EF 45%   Patient Profile     62 y.o. male inferolateral STEMI, ischemic cardiomyopathy EF 45%, DM with HTN, PTCA only of circumflex with residual 3v dz  Assessment & Plan    STEMI awaiting CABG (Dr. Maren BeachVanTrigt)  - Heparin IV, ASA  - NTG IV  - Metoprolol  - High intensity statin  - EF 45%, lisinopril at home. Resume when able post op  - cardiac rehab when able post op  CAD  - PTCA only of circumflex, no stenting  - Hep IV now. Completed  Aggrastat  DM with HTN  - BP stable. ISS  Ischemic CM  - EF 45%, BB and ACE-I when able.   - Hopeful improvement post CABG, Trop 65 trending down. 24 hours from onset of pain to cath.   Donato SchultzMark Skains, MD     For questions or updates, please contact [facility] Please consult [email or web address] for contact info under        Signed, Donato SchultzMark Skains, MD  11/17/2018, 8:24 AM

## 2018-11-17 NOTE — Plan of Care (Signed)
  Problem: Coping: Goal: Level of anxiety will decrease Outcome: Not Progressing Note:  Patient discussed concerns with RN related to having CABG.  Patient stated he is not concerned about the surgery itself but has concerns about his recovery in terms of not being able to work.  Patient stated he has a friend who had CABG and he has been talking with him about his concerns and his friend has provided him with encouragement to have the surgery but the patient is still unsure.

## 2018-11-18 ENCOUNTER — Other Ambulatory Visit: Payer: Self-pay

## 2018-11-18 ENCOUNTER — Inpatient Hospital Stay (HOSPITAL_COMMUNITY): Payer: BLUE CROSS/BLUE SHIELD

## 2018-11-18 ENCOUNTER — Encounter (HOSPITAL_COMMUNITY)
Admission: EM | Disposition: A | Discharge status: Home / Self Care | Payer: Self-pay | Source: Home / Self Care | Attending: Cardiothoracic Surgery

## 2018-11-18 ENCOUNTER — Inpatient Hospital Stay (HOSPITAL_COMMUNITY): Payer: BLUE CROSS/BLUE SHIELD | Admitting: Certified Registered Nurse Anesthetist

## 2018-11-18 ENCOUNTER — Encounter (HOSPITAL_COMMUNITY): Payer: Self-pay

## 2018-11-18 DIAGNOSIS — Z951 Presence of aortocoronary bypass graft: Secondary | ICD-10-CM

## 2018-11-18 DIAGNOSIS — I2510 Atherosclerotic heart disease of native coronary artery without angina pectoris: Secondary | ICD-10-CM | POA: Diagnosis present

## 2018-11-18 HISTORY — PX: CORONARY ARTERY BYPASS GRAFT: SHX141

## 2018-11-18 HISTORY — PX: TEE WITHOUT CARDIOVERSION: SHX5443

## 2018-11-18 LAB — PROTIME-INR
INR: 1.34
Prothrombin Time: 16.5 seconds — ABNORMAL HIGH (ref 11.4–15.2)

## 2018-11-18 LAB — CBC
HCT: 37.1 % — ABNORMAL LOW (ref 39.0–52.0)
HCT: 27 % — ABNORMAL LOW (ref 39.0–52.0)
HCT: 27.3 % — ABNORMAL LOW (ref 39.0–52.0)
Hemoglobin: 12.8 g/dL — ABNORMAL LOW (ref 13.0–17.0)
Hemoglobin: 9.1 g/dL — ABNORMAL LOW (ref 13.0–17.0)
Hemoglobin: 9.3 g/dL — ABNORMAL LOW (ref 13.0–17.0)
MCH: 30.8 pg (ref 26.0–34.0)
MCH: 30.3 pg (ref 26.0–34.0)
MCH: 30.7 pg (ref 26.0–34.0)
MCHC: 34.5 g/dL (ref 30.0–36.0)
MCHC: 33.7 g/dL (ref 30.0–36.0)
MCHC: 34.1 g/dL (ref 30.0–36.0)
MCV: 89.2 fL (ref 80.0–100.0)
MCV: 90 fL (ref 80.0–100.0)
MCV: 90.1 fL (ref 80.0–100.0)
NRBC: 0 % (ref 0.0–0.2)
PLATELETS: 106 10*3/uL — AB (ref 150–400)
Platelets: 159 10*3/uL (ref 150–400)
Platelets: 125 10*3/uL — ABNORMAL LOW (ref 150–400)
RBC: 4.16 MIL/uL — ABNORMAL LOW (ref 4.22–5.81)
RBC: 3 MIL/uL — ABNORMAL LOW (ref 4.22–5.81)
RBC: 3.03 MIL/uL — ABNORMAL LOW (ref 4.22–5.81)
RDW: 11.4 % — ABNORMAL LOW (ref 11.5–15.5)
RDW: 11.5 % (ref 11.5–15.5)
RDW: 11.5 % (ref 11.5–15.5)
WBC: 8.2 10*3/uL (ref 4.0–10.5)
WBC: 12.5 10*3/uL — ABNORMAL HIGH (ref 4.0–10.5)
WBC: 12.8 10*3/uL — ABNORMAL HIGH (ref 4.0–10.5)
nRBC: 0 % (ref 0.0–0.2)
nRBC: 0 % (ref 0.0–0.2)

## 2018-11-18 LAB — POCT I-STAT, CHEM 8
BUN: 10 mg/dL (ref 8–23)
BUN: 11 mg/dL (ref 8–23)
BUN: 9 mg/dL (ref 8–23)
BUN: 10 mg/dL (ref 8–23)
BUN: 10 mg/dL (ref 8–23)
BUN: 11 mg/dL (ref 8–23)
BUN: 14 mg/dL (ref 8–23)
CALCIUM ION: 1.14 mmol/L — AB (ref 1.15–1.40)
CHLORIDE: 102 mmol/L (ref 98–111)
CREATININE: 0.5 mg/dL — AB (ref 0.61–1.24)
Calcium, Ion: 1.18 mmol/L (ref 1.15–1.40)
Calcium, Ion: 1 mmol/L — ABNORMAL LOW (ref 1.15–1.40)
Calcium, Ion: 1.04 mmol/L — ABNORMAL LOW (ref 1.15–1.40)
Calcium, Ion: 1.07 mmol/L — ABNORMAL LOW (ref 1.15–1.40)
Calcium, Ion: 1.22 mmol/L (ref 1.15–1.40)
Calcium, Ion: 1.2 mmol/L (ref 1.15–1.40)
Chloride: 103 mmol/L (ref 98–111)
Chloride: 104 mmol/L (ref 98–111)
Chloride: 100 mmol/L (ref 98–111)
Chloride: 102 mmol/L (ref 98–111)
Chloride: 106 mmol/L (ref 98–111)
Chloride: 105 mmol/L (ref 98–111)
Creatinine, Ser: 0.5 mg/dL — ABNORMAL LOW (ref 0.61–1.24)
Creatinine, Ser: 0.4 mg/dL — ABNORMAL LOW (ref 0.61–1.24)
Creatinine, Ser: 0.4 mg/dL — ABNORMAL LOW (ref 0.61–1.24)
Creatinine, Ser: 0.4 mg/dL — ABNORMAL LOW (ref 0.61–1.24)
Creatinine, Ser: 0.4 mg/dL — ABNORMAL LOW (ref 0.61–1.24)
Creatinine, Ser: 0.5 mg/dL — ABNORMAL LOW (ref 0.61–1.24)
GLUCOSE: 126 mg/dL — AB (ref 70–99)
Glucose, Bld: 126 mg/dL — ABNORMAL HIGH (ref 70–99)
Glucose, Bld: 116 mg/dL — ABNORMAL HIGH (ref 70–99)
Glucose, Bld: 156 mg/dL — ABNORMAL HIGH (ref 70–99)
Glucose, Bld: 158 mg/dL — ABNORMAL HIGH (ref 70–99)
Glucose, Bld: 142 mg/dL — ABNORMAL HIGH (ref 70–99)
Glucose, Bld: 105 mg/dL — ABNORMAL HIGH (ref 70–99)
HCT: 29 % — ABNORMAL LOW (ref 39.0–52.0)
HCT: 30 % — ABNORMAL LOW (ref 39.0–52.0)
HCT: 25 % — ABNORMAL LOW (ref 39.0–52.0)
HCT: 26 % — ABNORMAL LOW (ref 39.0–52.0)
HCT: 27 % — ABNORMAL LOW (ref 39.0–52.0)
HCT: 27 % — ABNORMAL LOW (ref 39.0–52.0)
HCT: 28 % — ABNORMAL LOW (ref 39.0–52.0)
Hemoglobin: 9.9 g/dL — ABNORMAL LOW (ref 13.0–17.0)
Hemoglobin: 10.2 g/dL — ABNORMAL LOW (ref 13.0–17.0)
Hemoglobin: 8.5 g/dL — ABNORMAL LOW (ref 13.0–17.0)
Hemoglobin: 8.8 g/dL — ABNORMAL LOW (ref 13.0–17.0)
Hemoglobin: 9.2 g/dL — ABNORMAL LOW (ref 13.0–17.0)
Hemoglobin: 9.2 g/dL — ABNORMAL LOW (ref 13.0–17.0)
Hemoglobin: 9.5 g/dL — ABNORMAL LOW (ref 13.0–17.0)
Potassium: 3.9 mmol/L (ref 3.5–5.1)
Potassium: 4 mmol/L (ref 3.5–5.1)
Potassium: 3.9 mmol/L (ref 3.5–5.1)
Potassium: 3.6 mmol/L (ref 3.5–5.1)
Potassium: 3.6 mmol/L (ref 3.5–5.1)
Potassium: 3.6 mmol/L (ref 3.5–5.1)
Potassium: 4.4 mmol/L (ref 3.5–5.1)
Sodium: 138 mmol/L (ref 135–145)
Sodium: 136 mmol/L (ref 135–145)
Sodium: 138 mmol/L (ref 135–145)
Sodium: 136 mmol/L (ref 135–145)
Sodium: 137 mmol/L (ref 135–145)
Sodium: 139 mmol/L (ref 135–145)
Sodium: 137 mmol/L (ref 135–145)
TCO2: 28 mmol/L (ref 22–32)
TCO2: 27 mmol/L (ref 22–32)
TCO2: 26 mmol/L (ref 22–32)
TCO2: 25 mmol/L (ref 22–32)
TCO2: 28 mmol/L (ref 22–32)
TCO2: 24 mmol/L (ref 22–32)
TCO2: 24 mmol/L (ref 22–32)

## 2018-11-18 LAB — POCT I-STAT 3, ART BLOOD GAS (G3+)
Acid-Base Excess: 2 mmol/L (ref 0.0–2.0)
Acid-Base Excess: 3 mmol/L — ABNORMAL HIGH (ref 0.0–2.0)
Acid-Base Excess: 4 mmol/L — ABNORMAL HIGH (ref 0.0–2.0)
Acid-base deficit: 1 mmol/L (ref 0.0–2.0)
Acid-base deficit: 3 mmol/L — ABNORMAL HIGH (ref 0.0–2.0)
Acid-base deficit: 1 mmol/L (ref 0.0–2.0)
BICARBONATE: 28.4 mmol/L — AB (ref 20.0–28.0)
Bicarbonate: 26.4 mmol/L (ref 20.0–28.0)
Bicarbonate: 26.9 mmol/L (ref 20.0–28.0)
Bicarbonate: 24.1 mmol/L (ref 20.0–28.0)
Bicarbonate: 23.2 mmol/L (ref 20.0–28.0)
Bicarbonate: 21.2 mmol/L (ref 20.0–28.0)
Bicarbonate: 23.6 mmol/L (ref 20.0–28.0)
O2 SAT: 100 %
O2 SAT: 97 %
O2 Saturation: 99 %
O2 Saturation: 100 %
O2 Saturation: 100 %
O2 Saturation: 98 %
O2 Saturation: 100 %
PCO2 ART: 39.3 mmHg (ref 32.0–48.0)
PCO2 ART: 37.2 mmHg (ref 32.0–48.0)
PO2 ART: 565 mmHg — AB (ref 83.0–108.0)
Patient temperature: 36.1
Patient temperature: 37.1
TCO2: 28 mmol/L (ref 22–32)
TCO2: 28 mmol/L (ref 22–32)
TCO2: 30 mmol/L (ref 22–32)
TCO2: 25 mmol/L (ref 22–32)
TCO2: 24 mmol/L (ref 22–32)
TCO2: 22 mmol/L (ref 22–32)
TCO2: 25 mmol/L (ref 22–32)
pCO2 arterial: 41.4 mmHg (ref 32.0–48.0)
pCO2 arterial: 40.7 mmHg (ref 32.0–48.0)
pCO2 arterial: 35.7 mmHg (ref 32.0–48.0)
pCO2 arterial: 34.6 mmHg (ref 32.0–48.0)
pCO2 arterial: 36.6 mmHg (ref 32.0–48.0)
pH, Arterial: 7.413 (ref 7.350–7.450)
pH, Arterial: 7.444 (ref 7.350–7.450)
pH, Arterial: 7.452 — ABNORMAL HIGH (ref 7.350–7.450)
pH, Arterial: 7.419 (ref 7.350–7.450)
pH, Arterial: 7.417 (ref 7.350–7.450)
pH, Arterial: 7.397 (ref 7.350–7.450)
pH, Arterial: 7.417 (ref 7.350–7.450)
pO2, Arterial: 149 mmHg — ABNORMAL HIGH (ref 83.0–108.0)
pO2, Arterial: 430 mmHg — ABNORMAL HIGH (ref 83.0–108.0)
pO2, Arterial: 192 mmHg — ABNORMAL HIGH (ref 83.0–108.0)
pO2, Arterial: 83 mmHg (ref 83.0–108.0)
pO2, Arterial: 105 mmHg (ref 83.0–108.0)
pO2, Arterial: 184 mmHg — ABNORMAL HIGH (ref 83.0–108.0)

## 2018-11-18 LAB — GLUCOSE, CAPILLARY
Glucose-Capillary: 100 mg/dL — ABNORMAL HIGH (ref 70–99)
Glucose-Capillary: 101 mg/dL — ABNORMAL HIGH (ref 70–99)
Glucose-Capillary: 103 mg/dL — ABNORMAL HIGH (ref 70–99)
Glucose-Capillary: 105 mg/dL — ABNORMAL HIGH (ref 70–99)
Glucose-Capillary: 109 mg/dL — ABNORMAL HIGH (ref 70–99)
Glucose-Capillary: 116 mg/dL — ABNORMAL HIGH (ref 70–99)
Glucose-Capillary: 125 mg/dL — ABNORMAL HIGH (ref 70–99)
Glucose-Capillary: 130 mg/dL — ABNORMAL HIGH (ref 70–99)
Glucose-Capillary: 135 mg/dL — ABNORMAL HIGH (ref 70–99)
Glucose-Capillary: 96 mg/dL (ref 70–99)

## 2018-11-18 LAB — BASIC METABOLIC PANEL
Anion gap: 7 (ref 5–15)
BUN: 11 mg/dL (ref 8–23)
CO2: 24 mmol/L (ref 22–32)
Calcium: 8.5 mg/dL — ABNORMAL LOW (ref 8.9–10.3)
Chloride: 105 mmol/L (ref 98–111)
Creatinine, Ser: 0.8 mg/dL (ref 0.61–1.24)
GFR calc Af Amer: >60 mL/min (ref >60)
GFR calc non Af Amer: >60 mL/min (ref >60)
Glucose, Bld: 156 mg/dL — ABNORMAL HIGH (ref 70–99)
Potassium: 3.7 mmol/L (ref 3.5–5.1)
Sodium: 136 mmol/L (ref 135–145)

## 2018-11-18 LAB — PLATELET COUNT: Platelets: 144 10*3/uL — ABNORMAL LOW (ref 150–400)

## 2018-11-18 LAB — MAGNESIUM: Magnesium: 2.7 mg/dL — ABNORMAL HIGH (ref 1.7–2.4)

## 2018-11-18 LAB — HEPARIN LEVEL (UNFRACTIONATED): Heparin Unfractionated: 0.42 IU/mL (ref 0.30–0.70)

## 2018-11-18 LAB — POCT I-STAT 4, (NA,K, GLUC, HGB,HCT)
Glucose, Bld: 120 mg/dL — ABNORMAL HIGH (ref 70–99)
HCT: 26 % — ABNORMAL LOW (ref 39.0–52.0)
Hemoglobin: 8.8 g/dL — ABNORMAL LOW (ref 13.0–17.0)
Potassium: 3.9 mmol/L (ref 3.5–5.1)
Sodium: 141 mmol/L (ref 135–145)

## 2018-11-18 LAB — CREATININE, SERUM
Creatinine, Ser: 0.69 mg/dL (ref 0.61–1.24)
GFR calc Af Amer: >60 mL/min (ref >60)
GFR calc non Af Amer: >60 mL/min (ref >60)

## 2018-11-18 LAB — HEMOGLOBIN AND HEMATOCRIT, BLOOD
HCT: 27.2 % — ABNORMAL LOW (ref 39.0–52.0)
Hemoglobin: 9.3 g/dL — ABNORMAL LOW (ref 13.0–17.0)

## 2018-11-18 LAB — APTT: aPTT: 31 seconds (ref 24–36)

## 2018-11-18 LAB — PREPARE RBC (CROSSMATCH)

## 2018-11-18 SURGERY — CORONARY ARTERY BYPASS GRAFTING (CABG)
Anesthesia: General | Site: Chest

## 2018-11-18 MED ORDER — FENTANYL CITRATE (PF) 250 MCG/5ML IJ SOLN
INTRAMUSCULAR | Status: AC
  Filled 2018-11-18: qty 25

## 2018-11-18 MED ORDER — SODIUM CHLORIDE 0.9 % IV SOLN: 250.0000 mL | INTRAVENOUS | Status: DC

## 2018-11-18 MED ORDER — MILRINONE LACTATE IN DEXTROSE 20-5 MG/100ML-% IV SOLN
0.1250 ug/kg/min | INTRAVENOUS | Status: DC
  Administered 2018-11-19: 0.125 ug/kg/min via INTRAVENOUS
  Filled 2018-11-18: qty 100

## 2018-11-18 MED ORDER — MIDAZOLAM HCL 2 MG/2ML IJ SOLN: 2.0000 mg | INTRAMUSCULAR | Status: DC | PRN

## 2018-11-18 MED ORDER — SODIUM CHLORIDE 0.9 % IV SOLN: INTRAVENOUS | Status: DC

## 2018-11-18 MED ORDER — SODIUM CHLORIDE 0.9% FLUSH
3.0000 mL | Freq: Two times a day (BID) | INTRAVENOUS | Status: DC
  Administered 2018-11-19 – 2018-11-22 (×8): 3 mL via INTRAVENOUS

## 2018-11-18 MED ORDER — DOCUSATE SODIUM 100 MG PO CAPS
200.0000 mg | ORAL_CAPSULE | Freq: Every day | ORAL | Status: DC
  Administered 2018-11-19 – 2018-11-21 (×3): 200 mg via ORAL
  Filled 2018-11-18 (×3): qty 2

## 2018-11-18 MED ORDER — ONDANSETRON HCL 4 MG/2ML IJ SOLN
INTRAMUSCULAR | Status: AC
  Filled 2018-11-18: qty 2

## 2018-11-18 MED ORDER — DEXMEDETOMIDINE HCL IN NACL 200 MCG/50ML IV SOLN: 0.0000 ug/kg/h | INTRAVENOUS | Status: DC

## 2018-11-18 MED ORDER — NITROGLYCERIN IN D5W 200-5 MCG/ML-% IV SOLN: 0.0000 ug/min | INTRAVENOUS | Status: DC

## 2018-11-18 MED ORDER — PROPOFOL 10 MG/ML IV BOLUS
INTRAVENOUS | Status: DC | PRN
  Administered 2018-11-18: 100 mg via INTRAVENOUS

## 2018-11-18 MED ORDER — INSULIN REGULAR BOLUS VIA INFUSION
0.0000 [IU] | Freq: Three times a day (TID) | INTRAVENOUS | Status: DC
  Filled 2018-11-18: qty 10

## 2018-11-18 MED ORDER — HEMOSTATIC AGENTS (NO CHARGE) OPTIME
TOPICAL | Status: DC | PRN
  Administered 2018-11-18: 3 via TOPICAL
  Administered 2018-11-18 (×3): 1 via TOPICAL

## 2018-11-18 MED ORDER — MORPHINE SULFATE (PF) 2 MG/ML IV SOLN
1.0000 mg | INTRAVENOUS | Status: DC | PRN
  Administered 2018-11-18 – 2018-11-19 (×5): 2 mg via INTRAVENOUS
  Filled 2018-11-18: qty 2
  Filled 2018-11-18 (×3): qty 1

## 2018-11-18 MED ORDER — ACETAMINOPHEN 160 MG/5ML PO SOLN: 650.0000 mg | Freq: Once | ORAL | Status: AC

## 2018-11-18 MED ORDER — LACTATED RINGERS IV SOLN
INTRAVENOUS | Status: DC
  Administered 2018-11-18 (×2): via INTRAVENOUS

## 2018-11-18 MED ORDER — SODIUM CHLORIDE 0.9% FLUSH
3.0000 mL | INTRAVENOUS | Status: DC | PRN
  Administered 2018-11-19: 3 mL via INTRAVENOUS
  Filled 2018-11-18: qty 3

## 2018-11-18 MED ORDER — ACETAMINOPHEN 500 MG PO TABS
1000.0000 mg | ORAL_TABLET | Freq: Four times a day (QID) | ORAL | Status: DC
  Administered 2018-11-19 – 2018-11-22 (×16): 1000 mg via ORAL
  Filled 2018-11-18 (×16): qty 2

## 2018-11-18 MED ORDER — LACTATED RINGERS IV SOLN
INTRAVENOUS | Status: DC | PRN
  Administered 2018-11-18: 08:00:00 via INTRAVENOUS

## 2018-11-18 MED ORDER — PANTOPRAZOLE SODIUM 40 MG PO TBEC
40.0000 mg | DELAYED_RELEASE_TABLET | Freq: Every day | ORAL | Status: DC
  Administered 2018-11-20 – 2018-11-23 (×4): 40 mg via ORAL
  Filled 2018-11-18 (×4): qty 1

## 2018-11-18 MED ORDER — METOPROLOL TARTRATE 12.5 MG HALF TABLET
12.5000 mg | ORAL_TABLET | Freq: Two times a day (BID) | ORAL | Status: DC
  Administered 2018-11-19 (×3): 12.5 mg via ORAL
  Filled 2018-11-18 (×3): qty 1

## 2018-11-18 MED ORDER — OXYCODONE HCL 5 MG PO TABS
5.0000 mg | ORAL_TABLET | ORAL | Status: DC | PRN
  Administered 2018-11-19 – 2018-11-20 (×10): 10 mg via ORAL
  Administered 2018-11-21: 5 mg via ORAL
  Administered 2018-11-21 (×2): 10 mg via ORAL
  Filled 2018-11-18 (×7): qty 2
  Filled 2018-11-18: qty 1
  Filled 2018-11-18 (×5): qty 2

## 2018-11-18 MED ORDER — BISACODYL 10 MG RE SUPP: 10.0000 mg | Freq: Every day | RECTAL | Status: DC

## 2018-11-18 MED ORDER — ROCURONIUM BROMIDE 50 MG/5ML IV SOSY
PREFILLED_SYRINGE | INTRAVENOUS | Status: AC
  Filled 2018-11-18: qty 5

## 2018-11-18 MED ORDER — ROCURONIUM BROMIDE 10 MG/ML (PF) SYRINGE
PREFILLED_SYRINGE | INTRAVENOUS | Status: DC | PRN
  Administered 2018-11-18 (×4): 50 mg via INTRAVENOUS

## 2018-11-18 MED ORDER — POTASSIUM CHLORIDE 10 MEQ/50ML IV SOLN: 10.0000 meq | INTRAVENOUS | Status: AC

## 2018-11-18 MED ORDER — HEPARIN SODIUM (PORCINE) 1000 UNIT/ML IJ SOLN
INTRAMUSCULAR | Status: DC | PRN
  Administered 2018-11-18: 21000 [IU] via INTRAVENOUS
  Administered 2018-11-18 (×2): 2000 [IU] via INTRAVENOUS

## 2018-11-18 MED ORDER — DEXAMETHASONE SODIUM PHOSPHATE 10 MG/ML IJ SOLN
INTRAMUSCULAR | Status: AC
  Filled 2018-11-18: qty 1

## 2018-11-18 MED ORDER — MIDAZOLAM HCL 2 MG/2ML IJ SOLN
INTRAMUSCULAR | Status: AC
  Filled 2018-11-18: qty 2

## 2018-11-18 MED ORDER — LACTATED RINGERS IV SOLN: 500.0000 mL | Freq: Once | INTRAVENOUS | Status: DC | PRN

## 2018-11-18 MED ORDER — MIDAZOLAM HCL 2 MG/2ML IJ SOLN
INTRAMUSCULAR | Status: AC
  Filled 2018-11-18: qty 10

## 2018-11-18 MED ORDER — ALBUMIN HUMAN 5 % IV SOLN
250.0000 mL | INTRAVENOUS | Status: AC | PRN
  Administered 2018-11-18 – 2018-11-19 (×3): 12.5 g via INTRAVENOUS
  Filled 2018-11-18: qty 250

## 2018-11-18 MED ORDER — VANCOMYCIN HCL IN DEXTROSE 1-5 GM/200ML-% IV SOLN
1000.0000 mg | Freq: Once | INTRAVENOUS | Status: AC
  Administered 2018-11-18: 1000 mg via INTRAVENOUS
  Filled 2018-11-18: qty 200

## 2018-11-18 MED ORDER — LACTATED RINGERS IV SOLN
INTRAVENOUS | Status: DC
  Administered 2018-11-19: 20 mL/h via INTRAVENOUS

## 2018-11-18 MED ORDER — FENTANYL CITRATE (PF) 100 MCG/2ML IJ SOLN
INTRAMUSCULAR | Status: AC
  Filled 2018-11-18: qty 2

## 2018-11-18 MED ORDER — TRAMADOL HCL 50 MG PO TABS
50.0000 mg | ORAL_TABLET | ORAL | Status: DC | PRN
  Administered 2018-11-19 – 2018-11-20 (×4): 50 mg via ORAL
  Filled 2018-11-18 (×4): qty 1

## 2018-11-18 MED ORDER — 0.9 % SODIUM CHLORIDE (POUR BTL) OPTIME
TOPICAL | Status: DC | PRN
  Administered 2018-11-18: 6000 mL

## 2018-11-18 MED ORDER — SODIUM CHLORIDE 0.9 % IV SOLN
INTRAVENOUS | Status: DC | PRN
  Administered 2018-11-18: 750 mg via INTRAVENOUS

## 2018-11-18 MED ORDER — MIDAZOLAM HCL 5 MG/5ML IJ SOLN
INTRAMUSCULAR | Status: DC | PRN
  Administered 2018-11-18: 3 mg via INTRAVENOUS
  Administered 2018-11-18: 1 mg via INTRAVENOUS
  Administered 2018-11-18 (×2): 3 mg via INTRAVENOUS

## 2018-11-18 MED ORDER — FAMOTIDINE IN NACL 20-0.9 MG/50ML-% IV SOLN: 20.0000 mg | Freq: Two times a day (BID) | INTRAVENOUS | Status: DC

## 2018-11-18 MED ORDER — ASPIRIN 81 MG PO CHEW: 324.0000 mg | CHEWABLE_TABLET | Freq: Every day | ORAL | Status: DC

## 2018-11-18 MED ORDER — ACETAMINOPHEN 160 MG/5ML PO SOLN: 1000.0000 mg | Freq: Four times a day (QID) | ORAL | Status: DC

## 2018-11-18 MED ORDER — PROTAMINE SULFATE 10 MG/ML IV SOLN
INTRAVENOUS | Status: AC
  Filled 2018-11-18: qty 25

## 2018-11-18 MED ORDER — ACETAMINOPHEN 650 MG RE SUPP
650.0000 mg | Freq: Once | RECTAL | Status: AC
  Administered 2018-11-18: 650 mg via RECTAL

## 2018-11-18 MED ORDER — METOCLOPRAMIDE HCL 5 MG/ML IJ SOLN
10.0000 mg | Freq: Four times a day (QID) | INTRAMUSCULAR | Status: DC
  Administered 2018-11-18 – 2018-11-21 (×12): 10 mg via INTRAVENOUS
  Filled 2018-11-18 (×12): qty 2

## 2018-11-18 MED ORDER — PROTAMINE SULFATE 10 MG/ML IV SOLN
INTRAVENOUS | Status: DC | PRN
  Administered 2018-11-18: 250 mg via INTRAVENOUS

## 2018-11-18 MED ORDER — LIDOCAINE 2% (20 MG/ML) 5 ML SYRINGE
INTRAMUSCULAR | Status: AC
  Filled 2018-11-18: qty 5

## 2018-11-18 MED ORDER — LIDOCAINE 2% (20 MG/ML) 5 ML SYRINGE
INTRAMUSCULAR | Status: DC | PRN
  Administered 2018-11-18: 100 mg via INTRAVENOUS

## 2018-11-18 MED ORDER — MAGNESIUM SULFATE 4 GM/100ML IV SOLN
INTRAVENOUS | Status: AC
  Administered 2018-11-18: 4 g via INTRAVENOUS
  Filled 2018-11-18: qty 100

## 2018-11-18 MED ORDER — ROCURONIUM BROMIDE 50 MG/5ML IV SOSY
PREFILLED_SYRINGE | INTRAVENOUS | Status: AC
  Filled 2018-11-18: qty 15

## 2018-11-18 MED ORDER — ASPIRIN EC 325 MG PO TBEC
325.0000 mg | DELAYED_RELEASE_TABLET | Freq: Every day | ORAL | Status: DC
  Administered 2018-11-19: 325 mg via ORAL
  Filled 2018-11-18: qty 1

## 2018-11-18 MED ORDER — PROPOFOL 10 MG/ML IV BOLUS
INTRAVENOUS | Status: AC
  Filled 2018-11-18: qty 20

## 2018-11-18 MED ORDER — METOPROLOL TARTRATE 5 MG/5ML IV SOLN: 2.5000 mg | INTRAVENOUS | Status: DC | PRN

## 2018-11-18 MED ORDER — SODIUM CHLORIDE 0.45 % IV SOLN
INTRAVENOUS | Status: DC | PRN
  Administered 2018-11-18: 16:00:00 via INTRAVENOUS

## 2018-11-18 MED ORDER — LACTATED RINGERS IV SOLN: INTRAVENOUS | Status: DC

## 2018-11-18 MED ORDER — METOPROLOL TARTRATE 25 MG/10 ML ORAL SUSPENSION: 12.5000 mg | Freq: Two times a day (BID) | ORAL | Status: DC

## 2018-11-18 MED ORDER — FENTANYL CITRATE (PF) 250 MCG/5ML IJ SOLN
INTRAMUSCULAR | Status: DC | PRN
  Administered 2018-11-18: 150 ug via INTRAVENOUS
  Administered 2018-11-18: 250 ug via INTRAVENOUS
  Administered 2018-11-18: 50 ug via INTRAVENOUS
  Administered 2018-11-18: 100 ug via INTRAVENOUS
  Administered 2018-11-18: 50 ug via INTRAVENOUS
  Administered 2018-11-18: 150 ug via INTRAVENOUS
  Administered 2018-11-18 (×2): 250 ug via INTRAVENOUS

## 2018-11-18 MED ORDER — CHLORHEXIDINE GLUCONATE 0.12 % MT SOLN
15.0000 mL | OROMUCOSAL | Status: AC
  Administered 2018-11-18: 15 mL via OROMUCOSAL

## 2018-11-18 MED ORDER — PHENYLEPHRINE HCL-NACL 20-0.9 MG/250ML-% IV SOLN: 0.0000 ug/min | INTRAVENOUS | Status: DC

## 2018-11-18 MED ORDER — HEPARIN SODIUM (PORCINE) 1000 UNIT/ML IJ SOLN
INTRAMUSCULAR | Status: AC
  Filled 2018-11-18: qty 1

## 2018-11-18 MED ORDER — SODIUM CHLORIDE 0.9% IV SOLUTION
Freq: Once | INTRAVENOUS | Status: DC
  Administered 2018-11-18: 16:00:00 via INTRAVENOUS

## 2018-11-18 MED ORDER — DEXAMETHASONE SODIUM PHOSPHATE 10 MG/ML IJ SOLN
INTRAMUSCULAR | Status: DC | PRN
  Administered 2018-11-18: 10 mg via INTRAVENOUS

## 2018-11-18 MED ORDER — MAGNESIUM SULFATE 4 GM/100ML IV SOLN
4.0000 g | Freq: Once | INTRAVENOUS | Status: AC
  Administered 2018-11-18: 4 g via INTRAVENOUS

## 2018-11-18 MED ORDER — SODIUM CHLORIDE 0.9 % IV SOLN
1.5000 g | Freq: Two times a day (BID) | INTRAVENOUS | Status: AC
  Administered 2018-11-18 – 2018-11-20 (×4): 1.5 g via INTRAVENOUS
  Filled 2018-11-18 (×4): qty 1.5

## 2018-11-18 MED ORDER — ONDANSETRON HCL 4 MG/2ML IJ SOLN: 4.0000 mg | Freq: Four times a day (QID) | INTRAMUSCULAR | Status: DC | PRN

## 2018-11-18 MED ORDER — ALBUMIN HUMAN 5 % IV SOLN
INTRAVENOUS | Status: DC | PRN
  Administered 2018-11-18 (×2): via INTRAVENOUS

## 2018-11-18 MED ORDER — BISACODYL 5 MG PO TBEC
10.0000 mg | DELAYED_RELEASE_TABLET | Freq: Every day | ORAL | Status: DC
  Administered 2018-11-19 – 2018-11-21 (×3): 10 mg via ORAL
  Filled 2018-11-18 (×3): qty 2

## 2018-11-18 MED ORDER — INSULIN REGULAR(HUMAN) IN NACL 100-0.9 UT/100ML-% IV SOLN
INTRAVENOUS | Status: DC
  Administered 2018-11-18: 1.8 [IU]/h via INTRAVENOUS

## 2018-11-18 SURGICAL SUPPLY — 104 items
ADAPTER CARDIO PERF ANTE/RETRO (ADAPTER) ×4 IMPLANT
BAG DECANTER FOR FLEXI CONT (MISCELLANEOUS) ×4 IMPLANT
BANDAGE ACE 4X5 VEL STRL LF (GAUZE/BANDAGES/DRESSINGS) ×4 IMPLANT
BANDAGE ACE 6X5 VEL STRL LF (GAUZE/BANDAGES/DRESSINGS) ×4 IMPLANT
BASKET HEART  (ORDER IN 25'S) (MISCELLANEOUS) ×1
BASKET HEART (ORDER IN 25'S) (MISCELLANEOUS) ×1
BASKET HEART (ORDER IN 25S) (MISCELLANEOUS) ×2 IMPLANT
BLADE CLIPPER SURG (BLADE) IMPLANT
BLADE NEEDLE 3 SS STRL (BLADE) ×3 IMPLANT
BLADE NEEDLE 3MM SS STRL (BLADE) ×1
BLADE STERNUM SYSTEM 6 (BLADE) ×4 IMPLANT
BLADE SURG 12 STRL SS (BLADE) ×4 IMPLANT
BNDG GAUZE ELAST 4 BULKY (GAUZE/BANDAGES/DRESSINGS) ×4 IMPLANT
CANISTER SUCT 3000ML PPV (MISCELLANEOUS) ×4 IMPLANT
CANNULA GUNDRY RCSP 15FR (MISCELLANEOUS) ×4 IMPLANT
CATH CPB KIT VANTRIGT (MISCELLANEOUS) ×4 IMPLANT
CATH ROBINSON RED A/P 18FR (CATHETERS) ×12 IMPLANT
CATH THORACIC 36FR RT ANG (CATHETERS) ×4 IMPLANT
CLIP FOGARTY SPRING 6M (CLIP) ×4 IMPLANT
CLIP VESOCCLUDE SM WIDE 24/CT (CLIP) ×8 IMPLANT
COVER WAND RF STERILE (DRAPES) ×4 IMPLANT
CRADLE DONUT ADULT HEAD (MISCELLANEOUS) ×4 IMPLANT
DERMABOND ADVANCED (GAUZE/BANDAGES/DRESSINGS) ×2
DERMABOND ADVANCED .7 DNX12 (GAUZE/BANDAGES/DRESSINGS) ×2 IMPLANT
DRAIN CHANNEL 32F RND 10.7 FF (WOUND CARE) ×4 IMPLANT
DRAPE CARDIOVASCULAR INCISE (DRAPES) ×2
DRAPE SLUSH/WARMER DISC (DRAPES) ×4 IMPLANT
DRAPE SRG 135X102X78XABS (DRAPES) ×2 IMPLANT
DRSG AQUACEL AG ADV 3.5X14 (GAUZE/BANDAGES/DRESSINGS) ×4 IMPLANT
DRSG OPSITE 6X11 MED (GAUZE/BANDAGES/DRESSINGS) ×4 IMPLANT
ELECT BLADE 4.0 EZ CLEAN MEGAD (MISCELLANEOUS) ×4
ELECT BLADE 6.5 EXT (BLADE) ×4 IMPLANT
ELECT CAUTERY BLADE 6.4 (BLADE) ×4 IMPLANT
ELECT REM PT RETURN 9FT ADLT (ELECTROSURGICAL) ×8
ELECTRODE BLDE 4.0 EZ CLN MEGD (MISCELLANEOUS) ×2 IMPLANT
ELECTRODE REM PT RTRN 9FT ADLT (ELECTROSURGICAL) ×4 IMPLANT
FELT TEFLON 1X6 (MISCELLANEOUS) ×8 IMPLANT
GAUZE SPONGE 4X4 12PLY STRL (GAUZE/BANDAGES/DRESSINGS) ×8 IMPLANT
GAUZE SPONGE 4X4 12PLY STRL LF (GAUZE/BANDAGES/DRESSINGS) ×8 IMPLANT
GLOVE BIO SURGEON STRL SZ 6.5 (GLOVE) ×21 IMPLANT
GLOVE BIO SURGEON STRL SZ7.5 (GLOVE) ×16 IMPLANT
GLOVE BIO SURGEONS STRL SZ 6.5 (GLOVE) ×7
GLOVE BIOGEL PI IND STRL 6 (GLOVE) ×8 IMPLANT
GLOVE BIOGEL PI INDICATOR 6 (GLOVE) ×8
GOWN STRL REUS W/ TWL LRG LVL3 (GOWN DISPOSABLE) ×18 IMPLANT
GOWN STRL REUS W/TWL LRG LVL3 (GOWN DISPOSABLE) ×18
HEMOSTAT POWDER SURGIFOAM 1G (HEMOSTASIS) ×12 IMPLANT
HEMOSTAT SURGICEL 2X14 (HEMOSTASIS) ×4 IMPLANT
INSERT FOGARTY XLG (MISCELLANEOUS) IMPLANT
KIT BASIN OR (CUSTOM PROCEDURE TRAY) ×4 IMPLANT
KIT SUCTION CATH 14FR (SUCTIONS) ×8 IMPLANT
KIT TURNOVER KIT B (KITS) ×4 IMPLANT
KIT VASOVIEW HEMOPRO 2 VH 4000 (KITS) ×4 IMPLANT
LEAD PACING MYOCARDI (MISCELLANEOUS) ×4 IMPLANT
MARKER GRAFT CORONARY BYPASS (MISCELLANEOUS) ×12 IMPLANT
NS IRRIG 1000ML POUR BTL (IV SOLUTION) ×20 IMPLANT
PACK E OPEN HEART (SUTURE) ×4 IMPLANT
PACK OPEN HEART (CUSTOM PROCEDURE TRAY) ×4 IMPLANT
PAD ARMBOARD 7.5X6 YLW CONV (MISCELLANEOUS) ×8 IMPLANT
PAD ELECT DEFIB RADIOL ZOLL (MISCELLANEOUS) ×4 IMPLANT
PEN SKIN MARKING BROAD (MISCELLANEOUS) ×4 IMPLANT
PENCIL BUTTON HOLSTER BLD 10FT (ELECTRODE) ×4 IMPLANT
PUNCH AORTIC ROTATE  4.5MM 8IN (MISCELLANEOUS) ×4 IMPLANT
PUNCH AORTIC ROTATE 4.0MM (MISCELLANEOUS) IMPLANT
PUNCH AORTIC ROTATE 4.5MM 8IN (MISCELLANEOUS) IMPLANT
PUNCH AORTIC ROTATE 5MM 8IN (MISCELLANEOUS) IMPLANT
SET CARDIOPLEGIA MPS 5001102 (MISCELLANEOUS) ×4 IMPLANT
SPONGE LAP 18X18 X RAY DECT (DISPOSABLE) ×4 IMPLANT
SURGIFLO W/THROMBIN 8M KIT (HEMOSTASIS) ×4 IMPLANT
SUT BONE WAX W31G (SUTURE) ×4 IMPLANT
SUT ETHIBOND V-5 VALVE (SUTURE) ×4 IMPLANT
SUT MNCRL AB 4-0 PS2 18 (SUTURE) ×4 IMPLANT
SUT PROLENE 3 0 SH DA (SUTURE) ×12 IMPLANT
SUT PROLENE 3 0 SH1 36 (SUTURE) IMPLANT
SUT PROLENE 4 0 RB 1 (SUTURE) ×2
SUT PROLENE 4 0 SH DA (SUTURE) ×4 IMPLANT
SUT PROLENE 4-0 RB1 .5 CRCL 36 (SUTURE) ×2 IMPLANT
SUT PROLENE 5 0 C 1 36 (SUTURE) IMPLANT
SUT PROLENE 6 0 C 1 30 (SUTURE) ×4 IMPLANT
SUT PROLENE 6 0 CC (SUTURE) ×44 IMPLANT
SUT PROLENE 8 0 BV175 6 (SUTURE) IMPLANT
SUT PROLENE BLUE 7 0 (SUTURE) ×8 IMPLANT
SUT PROLENE POLY MONO (SUTURE) ×4 IMPLANT
SUT SILK  1 MH (SUTURE)
SUT SILK 1 MH (SUTURE) IMPLANT
SUT SILK 2 0 SH CR/8 (SUTURE) ×8 IMPLANT
SUT SILK 3 0 SH CR/8 (SUTURE) ×4 IMPLANT
SUT STEEL 6MS V (SUTURE) ×8 IMPLANT
SUT STEEL SZ 6 DBL 3X14 BALL (SUTURE) ×8 IMPLANT
SUT VIC AB 1 CTX 36 (SUTURE) ×4
SUT VIC AB 1 CTX36XBRD ANBCTR (SUTURE) ×4 IMPLANT
SUT VIC AB 2-0 CT1 27 (SUTURE) ×2
SUT VIC AB 2-0 CT1 TAPERPNT 27 (SUTURE) ×2 IMPLANT
SUT VIC AB 2-0 CTX 27 (SUTURE) IMPLANT
SUT VIC AB 3-0 X1 27 (SUTURE) IMPLANT
SYSTEM SAHARA CHEST DRAIN ATS (WOUND CARE) ×4 IMPLANT
TAPE CLOTH SURG 4X10 WHT LF (GAUZE/BANDAGES/DRESSINGS) ×4 IMPLANT
TAPE PAPER 2X10 WHT MICROPORE (GAUZE/BANDAGES/DRESSINGS) ×4 IMPLANT
TOWEL GREEN STERILE (TOWEL DISPOSABLE) ×4 IMPLANT
TOWEL GREEN STERILE FF (TOWEL DISPOSABLE) ×4 IMPLANT
TRAY FOLEY SLVR 16FR TEMP STAT (SET/KITS/TRAYS/PACK) ×4 IMPLANT
TUBING INSUFFLATION (TUBING) ×4 IMPLANT
UNDERPAD 30X30 (UNDERPADS AND DIAPERS) ×4 IMPLANT
WATER STERILE IRR 1000ML POUR (IV SOLUTION) ×8 IMPLANT

## 2018-11-18 NOTE — Progress Notes (Signed)
SICU p.m. Rounds  Patient alert and in the process of being extubated Hemodynamics are stable on low-dose milrinone Sinus rhythm heart rate 80

## 2018-11-18 NOTE — Progress Notes (Signed)
Initiated Open Heart Rapid Wean per Protocol 

## 2018-11-18 NOTE — Anesthesia Procedure Notes (Deleted)
Central Venous Catheter Insertion Performed by: Dorris SinghGreen, Ara Grandmaison, MD Start/End11/29/2019 7:40 AM, 11/18/2018 7:55 AM Preanesthetic checklist: patient identified, IV checked, site marked, risks and benefits discussed, surgical consent, monitors and equipment checked, pre-op evaluation, timeout performed and anesthesia consent Position: Trendelenburg Lidocaine 1% used for infiltration and patient sedated Hand hygiene performed , maximum sterile barriers used  and Seldinger technique used Central line was placed.Sheath introducer Swan type:thermodilution Procedure performed using ultrasound guided technique. Ultrasound Notes:anatomy identified and image(s) printed for medical record Attempts: 1 Following insertion, line sutured, dressing applied and Biopatch. Post procedure assessment: blood return through all ports  Patient tolerated the procedure well with no immediate complications.

## 2018-11-18 NOTE — Progress Notes (Signed)
ANTICOAGULATION CONSULT NOTE - Follow Up Consult  Pharmacy Consult for heparin Indication: CAD awaiting CABG  Labs: Recent Labs    11/15/18 2020 11/16/18 0037 11/16/18 1646  11/17/18 0310 11/17/18 1031 11/17/18 1803 11/18/18 0307  HGB  --  12.5*  --   --  12.5*  --   --  12.8*  HCT  --  36.1*  --   --  36.0*  --   --  37.1*  PLT  --  168  --   --  158  --   --  159  LABPROT  --   --   --   --  14.4  --   --   --   INR  --   --   --   --  1.13  --   --   --   HEPARINUNFRC  --   --   --    < > 0.12* 0.24* 0.25* 0.42  CREATININE  --  0.75  --   --  0.81  --   --  0.80  TROPONINI >65.00*  --  34.34*  --  23.79*  --   --   --    < > = values in this interval not displayed.    Assessment/Plan:  62yo male therapeutic on heparin after rate changes. Will continue gtt at current rate until off for OR today.   Vernard GamblesVeronda Allahna Husband, PharmD, BCPS  11/18/2018,4:14 AM

## 2018-11-18 NOTE — Procedures (Signed)
Extubation Procedure Note  Pt extubated following Rapid Wean, Follows commands. NIF -22, VC 1.2L, Cuff leak+, No stridor. Extubated to 4L [place].   Patient Details:   Name: Philip [name] DOB: [birth year] MRN: [id number]   Airway Documentation:    Vent end date: 11/18/18 Vent end time: 1945   Evaluation  O2 sats: stable throughout Complications: No apparent complications Patient did tolerate procedure well. Bilateral Breath Sounds: Clear   Yes  Lianne BushyDaniel, [place] Crance C 11/18/2018, 7:52 PM

## 2018-11-18 NOTE — Anesthesia Procedure Notes (Signed)
Procedure Name: Intubation Date/Time: 11/18/2018 8:52 AM Performed by: Trinna Post., CRNA Pre-anesthesia Checklist: Patient identified, Emergency Drugs available, Suction available, Patient being monitored and Timeout performed Patient Re-evaluated:Patient Re-evaluated prior to induction Oxygen Delivery Method: Circle system utilized Preoxygenation: Pre-oxygenation with 100% oxygen Induction Type: IV induction Ventilation: Mask ventilation without difficulty Laryngoscope Size: Mac and 4 Grade View: Grade I Tube type: Oral Tube size: 8.0 mm Number of attempts: 1 Airway Equipment and Method: Stylet Placement Confirmation: ETT inserted through vocal cords under direct vision,  positive ETCO2 and breath sounds checked- equal and bilateral Secured at: 22 cm Tube secured with: Tape Dental Injury: Teeth and Oropharynx as per pre-operative assessment

## 2018-11-18 NOTE — Anesthesia Postprocedure Evaluation (Signed)
Anesthesia Post Note  Patient: Philip [name]  Procedure(s) Performed: CORONARY ARTERY BYPASS GRAFTING (CABG) TIMES FIVE USING LEFT INTERNAL MAMMARY ARTERY AND RIGHT GREATER SAPHENOUS VEIN HARVESTED ENDOSCOPICALLY. (N/A Chest) TRANSESOPHAGEAL ECHOCARDIOGRAM (TEE) (N/A )     Patient location during evaluation: SICU Anesthesia Type: General Level of consciousness: patient remains intubated per anesthesia plan Pain management: pain level controlled Vital Signs Assessment: post-procedure vital signs reviewed and stable Respiratory status: patient remains intubated per anesthesia plan Postop Assessment: no apparent nausea or vomiting    Last Vitals:  Vitals:   11/18/18 1745 11/18/18 1800  BP: 110/75 95/66  Pulse: 75 74  Resp: (!) 21 12  Temp: (!) 36.3 C (!) 36.4 C  SpO2: 100% 100%    Last Pain:  Vitals:   11/18/18 0810  TempSrc:   PainSc: 0-No pain                 Redford Behrle

## 2018-11-18 NOTE — Anesthesia Procedure Notes (Signed)
Arterial Line Insertion Start/End11/29/2019 8:15 AM Performed by: Rogelia BogaMueller, Thomas P, CRNA, CRNA  Patient location: Pre-op. Preanesthetic checklist: patient identified, IV checked, site marked, risks and benefits discussed, surgical consent, monitors and equipment checked, pre-op evaluation, timeout performed and anesthesia consent Lidocaine 1% used for infiltration and patient sedated Right, radial was placed Catheter size: 20 G Hand hygiene performed  and maximum sterile barriers used   Attempts: 1 Procedure performed without using ultrasound guided technique. Following insertion, dressing applied and Biopatch. Post procedure assessment: normal  Patient tolerated the procedure well with no immediate complications.

## 2018-11-18 NOTE — Brief Op Note (Signed)
11/15/2018 - 11/18/2018  1:06 PM  PATIENT:  Philip [name]  [age] y.o. male  PRE-OPERATIVE DIAGNOSIS:  1. S/p STEMI 2. CAD  POST-OPERATIVE DIAGNOSIS:  1. S/p STEMI 2.CAD  PROCEDURE:  TRANSESOPHAGEAL ECHOCARDIOGRAM (TEE), MEDIAN STERNOTOMY for CORONARY ARTERY BYPASS GRAFTING (CABG) TIMES 5 (LIMA to LAD, SVG to DIAGONAL, SVG to OM, SVG SEQUENTIALLY to PDA and PLB) USING LEFT INTERNAL MAMMARY ARTERY AND RIGHT GREATER SAPHENOUS VEIN HARVESTED ENDOSCOPICALLY.   SURGEON:  Surgeon(s) and Role:    Kerin PernaVan Trigt, Peter, MD - Primary  PHYSICIAN ASSISTANT: Doree Fudgeonielle Alani Sabbagh PA-C  ASSISTANTS: Doree Fudgeonielle Amair Shrout PA-C  ANESTHESIA:   general  EBL: Per anesthesia and perfusion record  BLOOD ADMINISTERED:One FFP and two PLTS  DRAINS: Chest tubes placed in the mediastinal and pleural spaces   COUNTS CORRECT :  YES  DICTATION: .Dragon Dictation  PLAN OF CARE: Admit to inpatient   PATIENT DISPOSITION:  ICU - intubated and hemodynamically stable.   Delay start of Pharmacological VTE agent (>24hrs) due to surgical blood loss or risk of bleeding: yes  BASELINE WEIGHT: 76.2 kg

## 2018-11-18 NOTE — Transfer of Care (Signed)
Immediate Anesthesia Transfer of Care Note  Patient: Philip [name]  Procedure(s) Performed: CORONARY ARTERY BYPASS GRAFTING (CABG) TIMES FIVE USING LEFT INTERNAL MAMMARY ARTERY AND RIGHT GREATER SAPHENOUS VEIN HARVESTED ENDOSCOPICALLY. (N/A Chest) TRANSESOPHAGEAL ECHOCARDIOGRAM (TEE) (N/A )  Patient Location: PACU and ICU  Anesthesia Type:General  Level of Consciousness: Patient remains intubated per anesthesia plan  Airway & Oxygen Therapy: Patient remains intubated per anesthesia plan  Post-op Assessment: Report given to RN and Post -op Vital signs reviewed and stable  Post vital signs: Reviewed and stable  Last Vitals:  Vitals Value Taken Time  BP    Temp    Pulse    Resp    SpO2      Last Pain:  Vitals:   11/18/18 0810  TempSrc:   PainSc: 0-No pain      Patients Stated Pain Goal: 3 (11/18/18 0810)  Complications: No apparent anesthesia complications

## 2018-11-18 NOTE — Progress Notes (Signed)
Pre Procedure note for inpatients:   Crissie SicklesWesley A Schwarzkopf has been scheduled for Procedure(s): CORONARY ARTERY BYPASS GRAFTING (CABG) (N/A) TRANSESOPHAGEAL ECHOCARDIOGRAM (TEE) (N/A) today. The various methods of treatment have been discussed with the patient. After consideration of the risks, benefits and treatment options the patient has consented to the planned procedure.   The patient has been seen and labs reviewed. There are no changes in the patient's condition to prevent proceeding with the planned procedure today.  Recent labs:  Lab Results  Component Value Date   WBC 8.2 11/18/2018   HGB 12.8 (L) 11/18/2018   HCT 37.1 (L) 11/18/2018   PLT 159 11/18/2018   GLUCOSE 156 (H) 11/18/2018   CHOL 95 11/16/2018   TRIG 75 11/16/2018   HDL 26 (L) 11/16/2018   LDLCALC 54 11/16/2018   ALT 28 11/17/2018   AST 63 (H) 11/17/2018   NA 136 11/18/2018   K 3.7 11/18/2018   CL 105 11/18/2018   CREATININE 0.80 11/18/2018   BUN 11 11/18/2018   CO2 24 11/18/2018   TSH 1.740 11/17/2018   INR 1.13 11/17/2018   HGBA1C 7.6 (H) 11/17/2018    Mikey BussingPeter Van Trigt III, MD 11/18/2018 7:26 AM

## 2018-11-18 NOTE — Anesthesia Preprocedure Evaluation (Addendum)
Anesthesia Evaluation  Patient identified by MRN, date of birth, ID band Patient awake    Reviewed: Allergy & Precautions, NPO status , Patient's Chart, lab work & pertinent test results  Airway Mallampati: II  TM Distance: >3 FB     Dental   Pulmonary    breath sounds clear to auscultation       Cardiovascular hypertension, + CAD and + Past MI   Rhythm:Regular Rate:Normal     Neuro/Psych    GI/Hepatic negative GI ROS, Neg liver ROS,   Endo/Other  diabetes  Renal/GU negative Renal ROS     Musculoskeletal   Abdominal   Peds  Hematology   Anesthesia Other Findings   Reproductive/Obstetrics                             Anesthesia Physical Anesthesia Plan  ASA: IV  Anesthesia Plan: General   Post-op Pain Management:    Induction: Intravenous  PONV Risk Score and Plan: Treatment may vary due to age or medical condition and Midazolam  Airway Management Planned: Oral ETT  Additional Equipment: Arterial line, PA Cath, TEE and Ultrasound Guidance Line Placement  Intra-op Plan:   Post-operative Plan: Post-operative intubation/ventilation  Informed Consent: I have reviewed the patients History and Physical, chart, labs and discussed the procedure including the risks, benefits and alternatives for the proposed anesthesia with the patient or authorized representative who has indicated his/her understanding and acceptance.   Dental advisory given  Plan Discussed with: CRNA, Anesthesiologist and Surgeon  Anesthesia Plan Comments:        Anesthesia Quick Evaluation

## 2018-11-18 NOTE — Progress Notes (Signed)
  Echocardiogram Echocardiogram Transesophageal has been performed.  Leta JunglingCooper, Tacori Kvamme M 11/18/2018, 10:07 AM

## 2018-11-19 ENCOUNTER — Other Ambulatory Visit: Payer: Self-pay

## 2018-11-19 ENCOUNTER — Inpatient Hospital Stay (HOSPITAL_COMMUNITY): Payer: BLUE CROSS/BLUE SHIELD

## 2018-11-19 LAB — PREPARE FRESH FROZEN PLASMA: Unit division: 0

## 2018-11-19 LAB — CREATININE, SERUM
Creatinine, Ser: 0.9 mg/dL (ref 0.61–1.24)
GFR calc Af Amer: >60 mL/min (ref >60)
GFR calc non Af Amer: >60 mL/min (ref >60)

## 2018-11-19 LAB — CBC
HCT: 29.3 % — ABNORMAL LOW (ref 39.0–52.0)
HEMATOCRIT: 28 % — AB (ref 39.0–52.0)
Hemoglobin: 9.4 g/dL — ABNORMAL LOW (ref 13.0–17.0)
Hemoglobin: 9.9 g/dL — ABNORMAL LOW (ref 13.0–17.0)
MCH: 30.3 pg (ref 26.0–34.0)
MCH: 31.1 pg (ref 26.0–34.0)
MCHC: 33.6 g/dL (ref 30.0–36.0)
MCHC: 33.8 g/dL (ref 30.0–36.0)
MCV: 90.3 fL (ref 80.0–100.0)
MCV: 92.1 fL (ref 80.0–100.0)
Platelets: 128 10*3/uL — ABNORMAL LOW (ref 150–400)
Platelets: 163 10*3/uL (ref 150–400)
RBC: 3.1 MIL/uL — AB (ref 4.22–5.81)
RBC: 3.18 MIL/uL — ABNORMAL LOW (ref 4.22–5.81)
RDW: 11.6 % (ref 11.5–15.5)
RDW: 11.7 % (ref 11.5–15.5)
WBC: 15.3 10*3/uL — ABNORMAL HIGH (ref 4.0–10.5)
WBC: 16.2 10*3/uL — ABNORMAL HIGH (ref 4.0–10.5)
nRBC: 0 % (ref 0.0–0.2)
nRBC: 0 % (ref 0.0–0.2)

## 2018-11-19 LAB — BASIC METABOLIC PANEL
Anion gap: 8 (ref 5–15)
BUN: 11 mg/dL (ref 8–23)
CO2: 19 mmol/L — AB (ref 22–32)
Calcium: 7.4 mg/dL — ABNORMAL LOW (ref 8.9–10.3)
Chloride: 109 mmol/L (ref 98–111)
Creatinine, Ser: 0.66 mg/dL (ref 0.61–1.24)
GFR calc Af Amer: >60 mL/min (ref >60)
GFR calc non Af Amer: >60 mL/min (ref >60)
Glucose, Bld: 102 mg/dL — ABNORMAL HIGH (ref 70–99)
POTASSIUM: 4 mmol/L (ref 3.5–5.1)
Sodium: 136 mmol/L (ref 135–145)

## 2018-11-19 LAB — BPAM FFP
Blood Product Expiration Date: 201912042359
Blood Product Expiration Date: 201912042359
ISSUE DATE / TIME: 201911291312
ISSUE DATE / TIME: 201911291317
Unit Type and Rh: 7300
Unit Type and Rh: 7300

## 2018-11-19 LAB — MAGNESIUM
Magnesium: 2.2 mg/dL (ref 1.7–2.4)
Magnesium: 2.2 mg/dL (ref 1.7–2.4)

## 2018-11-19 LAB — GLUCOSE, CAPILLARY
GLUCOSE-CAPILLARY: 207 mg/dL — AB (ref 70–99)
GLUCOSE-CAPILLARY: 179 mg/dL — AB (ref 70–99)
Glucose-Capillary: 100 mg/dL — ABNORMAL HIGH (ref 70–99)
Glucose-Capillary: 104 mg/dL — ABNORMAL HIGH (ref 70–99)
Glucose-Capillary: 112 mg/dL — ABNORMAL HIGH (ref 70–99)
Glucose-Capillary: 120 mg/dL — ABNORMAL HIGH (ref 70–99)
Glucose-Capillary: 212 mg/dL — ABNORMAL HIGH (ref 70–99)
Glucose-Capillary: 244 mg/dL — ABNORMAL HIGH (ref 70–99)
Glucose-Capillary: 302 mg/dL — ABNORMAL HIGH (ref 70–99)
Glucose-Capillary: 96 mg/dL (ref 70–99)
Glucose-Capillary: 98 mg/dL (ref 70–99)
Glucose-Capillary: 99 mg/dL (ref 70–99)

## 2018-11-19 LAB — POCT I-STAT, CHEM 8
BUN: 14 mg/dL (ref 8–23)
Calcium, Ion: 1.14 mmol/L — ABNORMAL LOW (ref 1.15–1.40)
Chloride: 99 mmol/L (ref 98–111)
Creatinine, Ser: 0.7 mg/dL (ref 0.61–1.24)
Glucose, Bld: 226 mg/dL — ABNORMAL HIGH (ref 70–99)
HCT: 29 % — ABNORMAL LOW (ref 39.0–52.0)
Hemoglobin: 9.9 g/dL — ABNORMAL LOW (ref 13.0–17.0)
Potassium: 4.4 mmol/L (ref 3.5–5.1)
Sodium: 133 mmol/L — ABNORMAL LOW (ref 135–145)
TCO2: 25 mmol/L (ref 22–32)

## 2018-11-19 LAB — COOXEMETRY PANEL
Carboxyhemoglobin: 1 % (ref 0.5–1.5)
Methemoglobin: 1.8 % — ABNORMAL HIGH (ref 0.0–1.5)
O2 Saturation: 68.7 %
Total hemoglobin: 10.4 g/dL — ABNORMAL LOW (ref 12.0–16.0)

## 2018-11-19 MED ORDER — ENOXAPARIN SODIUM 40 MG/0.4ML ~~LOC~~ SOLN
40.0000 mg | SUBCUTANEOUS | Status: DC
  Administered 2018-11-19: 40 mg via SUBCUTANEOUS
  Filled 2018-11-19: qty 0.4

## 2018-11-19 MED ORDER — INSULIN ASPART 100 UNIT/ML ~~LOC~~ SOLN
0.0000 [IU] | SUBCUTANEOUS | Status: DC
  Administered 2018-11-19: 8 [IU] via SUBCUTANEOUS
  Administered 2018-11-19: 4 [IU] via SUBCUTANEOUS
  Administered 2018-11-19: 8 [IU] via SUBCUTANEOUS
  Administered 2018-11-20: 2 [IU] via SUBCUTANEOUS
  Administered 2018-11-20: 8 [IU] via SUBCUTANEOUS
  Administered 2018-11-20: 2 [IU] via SUBCUTANEOUS

## 2018-11-19 MED ORDER — INSULIN GLARGINE 100 UNIT/ML ~~LOC~~ SOLN
15.0000 [IU] | Freq: Two times a day (BID) | SUBCUTANEOUS | Status: DC
  Administered 2018-11-19 – 2018-11-20 (×3): 15 [IU] via SUBCUTANEOUS
  Filled 2018-11-19 (×4): qty 0.15

## 2018-11-19 MED ORDER — INFLUENZA VAC SPLIT QUAD 0.5 ML IM SUSY: 0.5000 mL | PREFILLED_SYRINGE | INTRAMUSCULAR | Status: DC | PRN

## 2018-11-19 MED ORDER — INSULIN ASPART 100 UNIT/ML ~~LOC~~ SOLN
5.0000 [IU] | Freq: Three times a day (TID) | SUBCUTANEOUS | Status: DC
  Administered 2018-11-20 (×2): 5 [IU] via SUBCUTANEOUS

## 2018-11-19 MED ORDER — SODIUM CHLORIDE 0.9% FLUSH
10.0000 mL | Freq: Two times a day (BID) | INTRAVENOUS | Status: DC
  Administered 2018-11-19 – 2018-11-20 (×2): 10 mL

## 2018-11-19 MED ORDER — INFLUENZA VAC SPLIT HIGH-DOSE 0.5 ML IM SUSY: 0.5000 mL | PREFILLED_SYRINGE | INTRAMUSCULAR | Status: DC | PRN

## 2018-11-19 MED ORDER — FUROSEMIDE 10 MG/ML IJ SOLN
20.0000 mg | Freq: Two times a day (BID) | INTRAMUSCULAR | Status: DC
  Administered 2018-11-19 – 2018-11-20 (×4): 20 mg via INTRAVENOUS
  Filled 2018-11-19 (×4): qty 2

## 2018-11-19 MED ORDER — POTASSIUM CHLORIDE 10 MEQ/50ML IV SOLN
10.0000 meq | INTRAVENOUS | Status: AC
  Administered 2018-11-19 (×2): 10 meq via INTRAVENOUS

## 2018-11-19 MED ORDER — MIDAZOLAM HCL 2 MG/2ML IJ SOLN: 1.0000 mg | INTRAMUSCULAR | Status: DC | PRN

## 2018-11-19 MED ORDER — INSULIN ASPART 100 UNIT/ML ~~LOC~~ SOLN
3.0000 [IU] | Freq: Three times a day (TID) | SUBCUTANEOUS | Status: DC
  Administered 2018-11-19 (×2): 3 [IU] via SUBCUTANEOUS

## 2018-11-19 MED ORDER — SODIUM CHLORIDE 0.9% FLUSH: 10.0000 mL | INTRAVENOUS | Status: DC | PRN

## 2018-11-19 MED ORDER — CHLORHEXIDINE GLUCONATE CLOTH 2 % EX PADS
6.0000 | MEDICATED_PAD | Freq: Every day | CUTANEOUS | Status: DC
  Administered 2018-11-19 – 2018-11-20 (×2): 6 via TOPICAL

## 2018-11-19 NOTE — Progress Notes (Signed)
CT surgery p.m. Rounds  Patient had stable day out of bed to chair Maintaining sinus rhythm Chest tubes removed this afternoon for minimal drainage Twice daily Lantus started to improve diabetic coverage P.m. labs otherwise satisfactory

## 2018-11-19 NOTE — Progress Notes (Signed)
1 Day Post-Op Procedure(s) (LRB): CORONARY ARTERY BYPASS GRAFTING (CABG) TIMES FIVE USING LEFT INTERNAL MAMMARY ARTERY AND RIGHT GREATER SAPHENOUS VEIN HARVESTED ENDOSCOPICALLY. (N/A) TRANSESOPHAGEAL ECHOCARDIOGRAM (TEE) (N/A) Subjective: Multivessel CABG after completed lateral wall STEMI with subsequent PCTA of circumflex Patient alert and extubated, pain fairly well controlled Sinus rhythm with stable hemodynamics Volume overloaded, Lasix ordered Cardiac index 2.2 on low-dose milrinone Objective: Vital signs in last 24 hours: Temp:  [97 F (36.1 C)-99.7 F (37.6 C)] 98.4 F (36.9 C) (11/30 0900) Pulse Rate:  [70-95] 84 (11/30 0900) Cardiac Rhythm: Normal sinus rhythm (11/30 0730) Resp:  [10-27] 18 (11/30 0900) BP: (92-137)/(60-87) 112/68 (11/30 0900) SpO2:  [96 %-100 %] 99 % (11/30 0900) Arterial Line BP: (87-167)/(48-84) 149/55 (11/30 0900) FiO2 (%):  [40 %-50 %] 40 % (11/29 1922) Weight:  [82.2 kg] 82.2 kg (11/30 0600)  Hemodynamic parameters for last 24 hours: PAP: (10-32)/(2-20) 25/20 CO:  [4.2 L/min-5.9 L/min] 5.2 L/min CI:  [2.2 L/min/m2-3 L/min/m2] 2.7 L/min/m2  Intake/Output from previous day: 11/29 0701 - 11/30 0700 In: 6366.5 [P.O.:420; I.V.:3724.6; Blood:729; IV Piggyback:1492.9] Out: 2534 [Urine:1637; Blood:650; Chest Tube:247] Intake/Output this shift: Total I/O In: 482.9 [P.O.:360; I.V.:79.7; IV Piggyback:43.2] Out: 300 [Urine:300]  Alert and comfortable Lungs clear Abdomen soft Remedies warm Neuro intact  Lab Results: Recent Labs    11/18/18 2144 11/18/18 2145 11/19/18 0307  WBC 12.8*  --  15.3*  HGB 9.3* 9.5* 9.4*  HCT 27.3* 28.0* 28.0*  PLT 125*  --  128*   BMET:  Recent Labs    11/18/18 0307  11/18/18 2145 11/19/18 0307  NA 136   < > 137 136  K 3.7   < > 4.4 4.0  CL 105   < > 105 109  CO2 24  --   --  19*  GLUCOSE 156*   < > 105* 102*  BUN 11   < > 14 11  CREATININE 0.80   < > 0.50* 0.66  CALCIUM 8.5*  --   --  7.4*   < > =  values in this interval not displayed.    PT/INR:  Recent Labs    11/18/18 1537  LABPROT 16.5*  INR 1.34   ABG    Component Value Date/Time   PHART 7.417 11/18/2018 2051   HCO3 23.6 11/18/2018 2051   TCO2 24 11/18/2018 2145   ACIDBASEDEF 1.0 11/18/2018 2051   O2SAT 68.7 11/19/2018 0510   CBG (last 3)  Recent Labs    11/19/18 0520 11/19/18 0627 11/19/18 0733  GLUCAP 98 99 96    Assessment/Plan: S/P Procedure(s) (LRB): CORONARY ARTERY BYPASS GRAFTING (CABG) TIMES FIVE USING LEFT INTERNAL MAMMARY ARTERY AND RIGHT GREATER SAPHENOUS VEIN HARVESTED ENDOSCOPICALLY. (N/A) TRANSESOPHAGEAL ECHOCARDIOGRAM (TEE) (N/A) Mobilize Diuresis Diabetes control See progression orders   LOS: 4 days    Kathlee Nationseter Van Trigt III 11/19/2018

## 2018-11-19 NOTE — Plan of Care (Signed)
  Problem: Clinical Measurements: Goal: Ability to maintain clinical measurements within normal limits will improve Outcome: Progressing Goal: Diagnostic test results will improve Outcome: Progressing Goal: Cardiovascular complication will be avoided Outcome: Progressing   Problem: Activity: Goal: Risk for activity intolerance will decrease Outcome: Progressing   Problem: Nutrition: Goal: Adequate nutrition will be maintained Outcome: Progressing   

## 2018-11-19 NOTE — Op Note (Signed)
NAMCrissie [name]: [name], [name] MEDICAL RECORD [id number] ACCOUNT [id number] DATE OF BIRTH:12-17-1956 FACILITY: [facility] LOCATION: [facility] PHYSICIAN:Philip Hoelzel VAN [name] III, MD  OPERATIVE REPORT  DATE OF PROCEDURE:  11/18/2018  PREOPERATIVE DIAGNOSIS:  Acute ST elevation myocardial infarction from occluded circumflex with successful PTCA opening of the infarct vessel and with residual severe 3-vessel coronary artery disease.  POSTOPERATIVE DIAGNOSIS:  Acute ST elevation myocardial infarction from occluded circumflex with successful PTCA opening of the infarct vessel and with residual severe 3-vessel coronary artery disease.  PROCEDURE:   1.  Coronary artery bypass grafting x5 (left internal mammary artery to left anterior descending, saphenous vein graft to diagonal, sequential saphenous vein graft to posterior descending and posterolateral.  Saphenous vein graft to obtuse marginal. 2.  Endoscopic harvest of right leg greater saphenous vein.  SURGEON:  Kerin PernaPeter Van Trigt, MD  ASSISTANT:  Philip Fudgeonielle Zimmerman, PA-C.  ANESTHESIA:  General by Dr. Dorris Singhharlene Green,  MD.  DESCRIPTION OF PROCEDURE:  After the patient had been seen in detail for consultation and after reviewing his coronary angiogram images, echocardiogram images, and x-ray images and informed consent was obtained and documented in the presurgical  preparation area, the patient was brought to the operating room and placed supine on the operating table.  The patient had been fully informed of the details of surgery, the benefits of surgery to include improved survival and preservation of LV function  and the risks of surgery to include the risks of bleeding, stroke, blood transfusion, postoperative pulmonary problems including pleural effusion, postoperative organ failure, and postoperative death.  The patient was placed on the OR table.  General anesthesia was induced.  A transesophageal echo probe was placed which showed some lateral hypokinesia  and mild mitral regurgitation.  The patient was prepped and draped as a sterile field.  A proper  time-out was performed.  A sternal incision was made as the saphenous vein was harvested endoscopically from the right leg.  The left internal mammary artery was harvested as a pedicle graft from its origin at the subclavian vessels.  It was a somewhat  thin and fragile vessel as where most of his conduit vessels.  The sternal retractor was placed, and the pericardium was opened and suspended.  Pursestrings were placed in the ascending aorta and right atrium and after the vein had been harvested,  heparin was administered.  The saphenous vein was inspected and found to be somewhat thin and fragile and required multiple 7-0 Prolene sutures to obtain hemostasis.  The patient was cannulated and placed on bypass after the ACT was documented as being therapeutic.  The coronaries were identified for grafting.  The heart had a large ecchymotic inflamed area on the lateral wall from the recent completed infarct.  Cardioplegic cannulas were placed both antegrade and retrograde cold blood cardioplegia, and the patient was cooled to 32 degrees.  The aortic crossclamp was applied and the heart was perfused with  1 L of cold blood cardioplegia.  The split between the ascending aortic and coronary sinus catheters.  There was good cardioplegic arrest and supple temperature dropped less than 14 degrees.  Cardioplegia was delivered every 20 minutes.  The distal coronary anastomoses were performed.  The first distal anastomosis was the sequential vein graft to the posterior descending and to the posterolateral.  Both vessels had a 90% stenosis.  The vein was sewn side-to-side with 7-0 Prolene to the  posterior descending, then continued and sewn end-to-side to the posterolateral.  Cardioplegia was redosed.  The third  distal anastomosis was the previously occluded OM vessel, the infarct vessel.  This was 1.5 mm.  It was  heavily diseased.  A reverse saphenous vein was sewn end-to-side with running 7-0 Prolene.  There was good flow through the graft.   Cardioplegia was redosed.  The fourth distal anastomosis was to the diagonal branch.  This had a proximal 99% stenosis.  Reverse saphenous vein was sewn end-to-side with running 7-0 Prolene with good flow through the graft.  Cardioplegia was redosed.  The fifth and final distal anastomosis was the distal third of the LAD.  Proximally, it had diffuse calcified diabetic type disease.  The left IMA pedicle was brought through an opening in the left lateral pericardium and was brought down onto the LAD  and sewn end-to-side with running 8-0 Prolene.  There was good flow through the anastomosis after briefly releasing the pedicle bulldog and the mammary artery.  The bulldog was reapplied.  The pedicle was secured to the epicardium.  Cardioplegia was  redosed.  The 3 proximal vein anastomoses were performed using a 4.5 mm punch and running 6-0 Prolene, and prior to tying down the final proximal anastomosis, air was vented from the coronaries with a dose of retrograde warm blood cardioplegia.  The crossclamp was  removed.  The heart resumed a spontaneous rhythm.  The vein grafts were deaired and opened.  Each had good flow, and hemostasis was obtained at the proximal and distal sites.  The cardioplegia cannulas were removed.  The patient was rewarmed to reperfuse.   Temporary pacing wires were applied.  The lungs were expanded.  The patient was then weaned off cardiopulmonary bypass on minimal inotropic support, milrinone 0.125.    Echo showed preserved LV function.  Protamine was administered without adverse reaction.  There is still significant diffuse coagulopathy and 1 FFP was given with improved coagulation function.  The superior pericardial fat was closed over the aorta.  The anterior mediastinum and left pleural chest tubes were placed and brought out through separate  incisions.  The sternum was closed with a wire.  The patient remained stable.  The pectoralis  fascia was closed with a running #1 Vicryl.  The subcutaneous and skin layers were closed with a running Vicryl.  The patient had a chest x-ray and then returned to the ICU in stable condition.    Total cardiopulmonary bypass time was 141 minutes.  AN/NUANCE  D:11/18/2018 T:11/19/2018 JOB:[id number]

## 2018-11-20 ENCOUNTER — Inpatient Hospital Stay (HOSPITAL_COMMUNITY): Payer: BLUE CROSS/BLUE SHIELD

## 2018-11-20 LAB — GLUCOSE, CAPILLARY
Glucose-Capillary: 142 mg/dL — ABNORMAL HIGH (ref 70–99)
Glucose-Capillary: 121 mg/dL — ABNORMAL HIGH (ref 70–99)
Glucose-Capillary: 164 mg/dL — ABNORMAL HIGH (ref 70–99)
Glucose-Capillary: 110 mg/dL — ABNORMAL HIGH (ref 70–99)
Glucose-Capillary: 217 mg/dL — ABNORMAL HIGH (ref 70–99)

## 2018-11-20 LAB — CBC
HCT: 30.4 % — ABNORMAL LOW (ref 39.0–52.0)
Hemoglobin: 9.8 g/dL — ABNORMAL LOW (ref 13.0–17.0)
MCH: 29.6 pg (ref 26.0–34.0)
MCHC: 32.2 g/dL (ref 30.0–36.0)
MCV: 91.8 fL (ref 80.0–100.0)
Platelets: 167 10*3/uL (ref 150–400)
RBC: 3.31 MIL/uL — ABNORMAL LOW (ref 4.22–5.81)
RDW: 11.6 % (ref 11.5–15.5)
WBC: 13 10*3/uL — ABNORMAL HIGH (ref 4.0–10.5)
nRBC: 0 % (ref 0.0–0.2)

## 2018-11-20 LAB — COMPREHENSIVE METABOLIC PANEL
ALT: 19 U/L (ref 0–44)
AST: 24 U/L (ref 15–41)
Albumin: 3 g/dL — ABNORMAL LOW (ref 3.5–5.0)
Alkaline Phosphatase: 48 U/L (ref 38–126)
Anion gap: 9 (ref 5–15)
BUN: 15 mg/dL (ref 8–23)
CO2: 26 mmol/L (ref 22–32)
Calcium: 7.8 mg/dL — ABNORMAL LOW (ref 8.9–10.3)
Chloride: 98 mmol/L (ref 98–111)
Creatinine, Ser: 0.83 mg/dL (ref 0.61–1.24)
GFR calc Af Amer: >60 mL/min (ref >60)
GFR calc non Af Amer: >60 mL/min (ref >60)
Glucose, Bld: 127 mg/dL — ABNORMAL HIGH (ref 70–99)
Potassium: 3.9 mmol/L (ref 3.5–5.1)
Sodium: 133 mmol/L — ABNORMAL LOW (ref 135–145)
Total Bilirubin: 0.8 mg/dL (ref 0.3–1.2)
Total Protein: 5.7 g/dL — ABNORMAL LOW (ref 6.5–8.1)

## 2018-11-20 LAB — COOXEMETRY PANEL
Carboxyhemoglobin: 1.5 % (ref 0.5–1.5)
Methemoglobin: 1.1 % (ref 0.0–1.5)
O2 Saturation: 67.5 %
Total hemoglobin: 9.6 g/dL — ABNORMAL LOW (ref 12.0–16.0)

## 2018-11-20 MED ORDER — ASPIRIN EC 81 MG PO TBEC
81.0000 mg | DELAYED_RELEASE_TABLET | Freq: Every day | ORAL | Status: DC
  Administered 2018-11-20 – 2018-11-23 (×4): 81 mg via ORAL
  Filled 2018-11-20 (×4): qty 1

## 2018-11-20 MED ORDER — INSULIN ASPART 100 UNIT/ML ~~LOC~~ SOLN
0.0000 [IU] | Freq: Three times a day (TID) | SUBCUTANEOUS | Status: DC
  Administered 2018-11-20: 8 [IU] via SUBCUTANEOUS
  Administered 2018-11-20: 4 [IU] via SUBCUTANEOUS

## 2018-11-20 MED ORDER — ASPIRIN 81 MG PO CHEW: 81.0000 mg | CHEWABLE_TABLET | Freq: Every day | ORAL | Status: DC

## 2018-11-20 MED ORDER — ORAL CARE MOUTH RINSE
15.0000 mL | Freq: Two times a day (BID) | OROMUCOSAL | Status: DC
  Administered 2018-11-21: 15 mL via OROMUCOSAL

## 2018-11-20 MED ORDER — ENOXAPARIN SODIUM 30 MG/0.3ML ~~LOC~~ SOLN
30.0000 mg | SUBCUTANEOUS | Status: DC
  Administered 2018-11-20 – 2018-11-22 (×3): 30 mg via SUBCUTANEOUS
  Filled 2018-11-20 (×3): qty 0.3

## 2018-11-20 MED ORDER — CARVEDILOL 3.125 MG PO TABS
3.1250 mg | ORAL_TABLET | Freq: Two times a day (BID) | ORAL | Status: DC
  Administered 2018-11-20 – 2018-11-23 (×7): 3.125 mg via ORAL
  Filled 2018-11-20 (×7): qty 1

## 2018-11-20 MED ORDER — CLOPIDOGREL BISULFATE 75 MG PO TABS
75.0000 mg | ORAL_TABLET | Freq: Every day | ORAL | Status: DC
  Administered 2018-11-20 – 2018-11-23 (×4): 75 mg via ORAL
  Filled 2018-11-20 (×4): qty 1

## 2018-11-20 NOTE — Progress Notes (Signed)
Pt arrived from [facility]. Pt on o2 via cann@2lpm . Sat 96%. Telebox 15 applied. CCMD notified. CHG bath given. Pt family a bedside with belongings. Pt has no complaints. Lacy DuverneyJennifer Gale Hulse, RN

## 2018-11-20 NOTE — Progress Notes (Signed)
2 Days Post-Op Procedure(s) (LRB): CORONARY ARTERY BYPASS GRAFTING (CABG) TIMES FIVE USING LEFT INTERNAL MAMMARY ARTERY AND RIGHT GREATER SAPHENOUS VEIN HARVESTED ENDOSCOPICALLY. (N/A) TRANSESOPHAGEAL ECHOCARDIOGRAM (TEE) (N/A) Subjective: Walked unit nsr coox >60% Ready for stepdown Objective: Vital signs in last 24 hours: Temp:  [97.8 F (36.6 C)-99.6 F (37.6 C)] 98.8 F (37.1 C) (12/01 0748) Pulse Rate:  [77-99] 98 (12/01 0800) Cardiac Rhythm: Normal sinus rhythm (11/30 2000) Resp:  [11-23] 20 (12/01 0800) BP: (95-132)/(61-79) 119/66 (12/01 0800) SpO2:  [93 %-100 %] 95 % (12/01 0800) Arterial Line BP: (143-159)/(54-65) 151/65 (11/30 1100) Weight:  [82 kg] 82 kg (12/01 0500)  Hemodynamic parameters for last 24 hours:    Intake/Output from previous day: 11/30 0701 - 12/01 0700 In: 1888.5 [P.O.:1280; I.V.:308.6; IV Piggyback:299.9] Out: 2015 [Urine:1895; Chest Tube:120] Intake/Output this shift: No intake/output data recorded.       Exam    General- alert and comfortable    Neck- no JVD, no cervical adenopathy palpable, no carotid bruit   Lungs- clear without rales, wheezes   Cor- regular rate and rhythm, no murmur , gallop   Abdomen- soft, non-tender   Extremities - warm, non-tender, minimal edema   Neuro- oriented, appropriate, no focal weakness   Lab Results: Recent Labs    11/19/18 1531 11/19/18 1539 11/20/18 0500  WBC 16.2*  --  13.0*  HGB 9.9* 9.9* 9.8*  HCT 29.3* 29.0* 30.4*  PLT 163  --  167   BMET:  Recent Labs    11/19/18 0307  11/19/18 1539 11/20/18 0500  NA 136  --  133* 133*  K 4.0  --  4.4 3.9  CL 109  --  99 98  CO2 19*  --   --  26  GLUCOSE 102*  --  226* 127*  BUN 11  --  14 15  CREATININE 0.66   < > 0.70 0.83  CALCIUM 7.4*  --   --  7.8*   < > = values in this interval not displayed.    PT/INR:  Recent Labs    11/18/18 1537  LABPROT 16.5*  INR 1.34   ABG    Component Value Date/Time   PHART 7.417 11/18/2018 2051   HCO3 23.6 11/18/2018 2051   TCO2 25 11/19/2018 1539   ACIDBASEDEF 1.0 11/18/2018 2051   O2SAT 67.5 11/20/2018 0541   CBG (last 3)  Recent Labs    11/19/18 2351 11/20/18 0404 11/20/18 0751  GLUCAP 212* 142* 121*    Assessment/Plan: S/P Procedure(s) (LRB): CORONARY ARTERY BYPASS GRAFTING (CABG) TIMES FIVE USING LEFT INTERNAL MAMMARY ARTERY AND RIGHT GREATER SAPHENOUS VEIN HARVESTED ENDOSCOPICALLY. (N/A) TRANSESOPHAGEAL ECHOCARDIOGRAM (TEE) (N/A) Mobilize Diuresis Diabetes control Plan for transfer to step-down: see transfer orders add plavix for ACS   asa to 81mg  add low dose lisinopril later for STEMI, LV dysfunction   LOS: 5 days    Philip [name] 11/20/2018

## 2018-11-21 ENCOUNTER — Inpatient Hospital Stay (HOSPITAL_COMMUNITY): Payer: BLUE CROSS/BLUE SHIELD

## 2018-11-21 ENCOUNTER — Encounter (HOSPITAL_COMMUNITY): Payer: Self-pay | Admitting: Cardiothoracic Surgery

## 2018-11-21 ENCOUNTER — Telehealth: Payer: Self-pay

## 2018-11-21 DIAGNOSIS — Z951 Presence of aortocoronary bypass graft: Secondary | ICD-10-CM

## 2018-11-21 LAB — BPAM RBC
Blood Product Expiration Date: 201912062359
Blood Product Expiration Date: 201912082359
Blood Product Expiration Date: 201912202359
Blood Product Expiration Date: 201912232359
ISSUE DATE / TIME: 201911291527
ISSUE DATE / TIME: 201911291527
Unit Type and Rh: 7300
Unit Type and Rh: 7300
Unit Type and Rh: 7300
Unit Type and Rh: 7300

## 2018-11-21 LAB — COMPREHENSIVE METABOLIC PANEL
ALT: 20 U/L (ref 0–44)
AST: 24 U/L (ref 15–41)
Albumin: 2.8 g/dL — ABNORMAL LOW (ref 3.5–5.0)
Alkaline Phosphatase: 63 U/L (ref 38–126)
Anion gap: 5 (ref 5–15)
BUN: 16 mg/dL (ref 8–23)
CO2: 31 mmol/L (ref 22–32)
Calcium: 7.8 mg/dL — ABNORMAL LOW (ref 8.9–10.3)
Chloride: 98 mmol/L (ref 98–111)
Creatinine, Ser: 0.81 mg/dL (ref 0.61–1.24)
GFR calc Af Amer: >60 mL/min (ref >60)
GFR calc non Af Amer: >60 mL/min (ref >60)
Glucose, Bld: 123 mg/dL — ABNORMAL HIGH (ref 70–99)
Potassium: 3.7 mmol/L (ref 3.5–5.1)
Sodium: 134 mmol/L — ABNORMAL LOW (ref 135–145)
Total Bilirubin: 0.4 mg/dL (ref 0.3–1.2)
Total Protein: 5.9 g/dL — ABNORMAL LOW (ref 6.5–8.1)

## 2018-11-21 LAB — TYPE AND SCREEN
ABO/RH(D): B POS
Antibody Screen: NEGATIVE
Unit division: 0
Unit division: 0
Unit division: 0
Unit division: 0

## 2018-11-21 LAB — CBC
HCT: 27.9 % — ABNORMAL LOW (ref 39.0–52.0)
Hemoglobin: 9.2 g/dL — ABNORMAL LOW (ref 13.0–17.0)
MCH: 29.9 pg (ref 26.0–34.0)
MCHC: 33 g/dL (ref 30.0–36.0)
MCV: 90.6 fL (ref 80.0–100.0)
Platelets: 192 10*3/uL (ref 150–400)
RBC: 3.08 MIL/uL — ABNORMAL LOW (ref 4.22–5.81)
RDW: 11.5 % (ref 11.5–15.5)
WBC: 12.1 10*3/uL — ABNORMAL HIGH (ref 4.0–10.5)
nRBC: 0 % (ref 0.0–0.2)

## 2018-11-21 LAB — GLUCOSE, CAPILLARY
Glucose-Capillary: 121 mg/dL — ABNORMAL HIGH (ref 70–99)
Glucose-Capillary: 94 mg/dL (ref 70–99)
Glucose-Capillary: 175 mg/dL — ABNORMAL HIGH (ref 70–99)

## 2018-11-21 MED ORDER — GLIMEPIRIDE 4 MG PO TABS
2.0000 mg | ORAL_TABLET | Freq: Every day | ORAL | Status: DC
  Administered 2018-11-21: 2 mg via ORAL
  Filled 2018-11-21: qty 1

## 2018-11-21 MED ORDER — SORBITOL 70 % PO SOLN: 60.0000 mL | Freq: Every day | ORAL | Status: DC | PRN

## 2018-11-21 MED ORDER — FUROSEMIDE 40 MG PO TABS
40.0000 mg | ORAL_TABLET | Freq: Every day | ORAL | Status: DC
  Administered 2018-11-21 – 2018-11-23 (×3): 40 mg via ORAL
  Filled 2018-11-21 (×3): qty 1

## 2018-11-21 MED ORDER — LACTULOSE 10 GM/15ML PO SOLN
20.0000 g | Freq: Once | ORAL | Status: AC
  Administered 2018-11-21: 20 g via ORAL
  Filled 2018-11-21: qty 30

## 2018-11-21 MED ORDER — SORBITOL 70 % PO SOLN
60.0000 mL | Freq: Once | ORAL | Status: AC
  Administered 2018-11-21: 60 mL via ORAL
  Filled 2018-11-21: qty 60

## 2018-11-21 MED ORDER — POTASSIUM CHLORIDE CRYS ER 20 MEQ PO TBCR
30.0000 meq | EXTENDED_RELEASE_TABLET | Freq: Once | ORAL | Status: AC
  Administered 2018-11-21: 30 meq via ORAL
  Filled 2018-11-21: qty 1

## 2018-11-21 MED ORDER — METFORMIN HCL 850 MG PO TABS
850.0000 mg | ORAL_TABLET | Freq: Two times a day (BID) | ORAL | Status: DC
  Administered 2018-11-21 – 2018-11-23 (×5): 850 mg via ORAL
  Filled 2018-11-21 (×6): qty 1

## 2018-11-21 MED ORDER — METOLAZONE 5 MG PO TABS
5.0000 mg | ORAL_TABLET | Freq: Every day | ORAL | Status: AC
  Administered 2018-11-22: 5 mg via ORAL
  Filled 2018-11-21: qty 1

## 2018-11-21 MED ORDER — LISINOPRIL 2.5 MG PO TABS
2.5000 mg | ORAL_TABLET | Freq: Every day | ORAL | Status: DC
  Administered 2018-11-21 – 2018-11-23 (×3): 2.5 mg via ORAL
  Filled 2018-11-21 (×3): qty 1

## 2018-11-21 NOTE — Discharge Summary (Addendum)
Physician Discharge Summary       [address] [place]       [place] [address]             [phone]    Patient ID: Philip [name] MRN: [id number] DOB/AGE: Jun 06, 1956 62 y.o.  Admit date: 11/15/2018 Discharge date: 11/23/2018  Admission Diagnoses: 1. ST elevation myocardial infarction involving left circumflex coronary artery (HCC) 2. Coronary artery disease  Discharge Diagnoses:  1. S/P CABG x 5 2. ABL anemia 3. History of diabetes mellitus without complication (HCC) 4. History of hyperlipidemia 5. History of hypertension    Procedure (s):  Lennette Bihari, MD (Primary)    Procedures   LEFT HEART CATH AND CORONARY ANGIOGRAPHY on 11/15/2018:  Conclusion     Dist RCA lesion is 60% stenosed.  Ost RPDA lesion is 60% stenosed.  1st RPLB lesion is 90% stenosed.  2nd RPLB lesion is 85% stenosed.  Ost 1st Diag lesion is 95% stenosed.  1st Diag lesion is 60% stenosed.  Mid LAD lesion is 70% stenosed.  Prox Cx lesion is 100% stenosed.  Ost 2nd Mrg lesion is 70% stenosed.  Post intervention, there is a 40% residual stenosis.  The left ventricular ejection fraction is 50-55% by visual estimate.  LV end diastolic pressure is low.   Severe multivessel CAD with a large twin like LAD/diagonal system with high-grade 95% stenosis at the ostium of the proximal diagonal arising from the LAD with mid narrowing of 60%, moderate irregularity of the LAD with area of ectasia in the mid segment and focal mid distal 70% stenosis; total proximal occlusion of the left circumflex coronary artery; irregular dominant RCA with area of aneurysmal ectasia in the region of the acute margin followed by 60% stenosis, and ostial narrowing of 3 major posterior branches of 60, 90 and 80%.  There is very faint collateralization of the OM branch and distal circumflex  from the distal RCA.  Low normal global LV function with an EF of 50 to 55% with subtle small region of mid  anterolateral and mid posterior hypocontractility.  LVEDP 8 mm.  Successful PCI of the totally occluded circumflex vessel with PTCA alone with the 100% stenosis being reduced to 40% and TIMI 0 flow being improved to TIMI-3 flow.  There is mild residual thrombus at the site of initial occlusion.  RECOMMENDATION: Patient will be maintained on IV nitroglycerin currently at 30 mcg until CABG and will be started on low-dose beta-blocker therapy.  Bivalirudin will be continued for 4 hours post procedure and Aggrastat bolus plus infusion was initiated during the procedure and infusion should continue for minimum of 18 hours or later.  I have contacted Dr. Dorris Fetch at the completion of the procedure who is aware of the patient.  At present the patient is hemodynamically stable.  Surgical consultation will be obtained in a.m. ECG post procedures shows small Q waves inferiorly with early transition compatible with inferior posterior MI without significant ST elevation.  Once Aggrastat is discontinued recommend heparinization until CABG revascularization. P2Y12 therapy was not administered due to need for CABG revascularization.    1.  Coronary artery bypass grafting x5 (left internal mammary artery to left anterior descending, saphenous vein graft to diagonal, sequential saphenous vein graft to posterior descending and posterolateral.  Saphenous vein graft to obtuse marginal. 2.  Endoscopic harvest of right leg greater saphenous vein by Dr. Donata Clay on 11/18/2018.  History of Presenting Illness: Philip [name] is a 62 year old male patient  with a past medical history significant for diabetes mellitus type II and hypertension.  He is a non-smoker with no family history of coronary artery disease.  He began having significant chest discomfort on 11/14/2018 around 2 PM which was originally intermittent.  He then went to sleep and woke up at 3:30 AM feeling poor.  He then went to work and at 5:30 AM was having much  more significant chest discomfort and pressure with no radiation.  His supervisor encouraged him to go see a physician and he went to urgent care who then transferred him to the emergency room.  He denies headache or vision change, fevers, cough, shortness of breath, and abdominal pain.  Initial EKG demonstrated 1 to 2 mm of ST segment elevation in inferior leads.  His troponin was 17.9.  Peak troponin was greater than 65.  He continued to have chest pain while in the emergency department and his blood pressure was elevated.  Cardiology was consulted at this time.  Yesterday, he underwent cardiac catheterization which revealed severe multivessel coronary artery disease.  He had a low normal LV function with an EF of 50 to 55%.  IV nitroglycerin was initiated as well as low-dose beta-blocker therapy.  Aggrastat was also initiated and once given for a minimum of 18 hours will be switched to heparin until coronary artery bypass grafting revascularization can take place.  Due to multivessel disease we were consulted for surgical revascularization. In summary, 62 year old diabetic presents with completed non-STEMI with total occlusion of the circumflex marginal and high-grade stenosis of the LAD circulation and posterior descending branch of the RCA..  Cardiac enzymes peaked with troponin greater than 65 and now are on the decline.  Echocardiogram shows ejection fraction of 45% without valvular disease.  The patient underwent PCTA of the circumflex and was placed on Aggrastat.  He has remained hemodynamically stable and pain-free.  He has been recommended for surgical coronary  revascularization. Dr. Donata Clay discussed potential risks, benefits, and complications of the surgery. Pre operative carotid duplex US showed no significant internal carotid artery stenosis bilaterally. He underwent a CABG x 5 on 11/18/2018.  Brief Hospital Course:  The patient was extubated the evening of surgery without difficulty. He  remained afebrile and hemodynamically stable. Theone Murdoch, a line, chest tubes, and foley were removed early in the post operative course. Lopressor was started and titrated accordingly. He was volume over loaded and diuresed. He had ABL anemia. He did not require a post op transfusion. Last H and H was 10.2 and 30. He was weaned off the insulin drip.  Once he was tolerating a diet, Metformin and Glimeperide were restarted.  The patient's glucose remained well controlled. The patient's HGA1C pre op was 7.6. The patient was felt surgically stable for transfer from the ICU to [facility] for further convalescence on 11/20/2018. He continues to progress with cardiac rehab. He was ambulating on room air. He has been tolerating a diet and has had a bowel movement. Epicardial pacing wires were removed on 11/21/2018. As discussed with Dr. Donata Clay, low dose Lisinopril was started on 12/2. Chest tube sutures were removed on 12/03. The patient is felt surgically stable for discharge today.   Latest Vital Signs: Blood pressure 112/62, pulse 83, temperature 98.3 F (36.8 C), temperature source Oral, resp. rate 18, height 5\' 11"  (1.803 m), weight 79 kg, SpO2 95 %.  Physical Exam: Cardiovascular: RRR Pulmonary: Slightly diminished bases Abdomen: Soft, non tender, bowel sounds present. Extremities: Trace bilateral  lower extremity edema. Ecchymosis right thigh Wounds: Sternal wound is clean and dry.  No erythema or signs of infection. RLE wounds are clean and dry  Discharge Condition: Stable and discharged to home.  Recent laboratory studies:  Lab Results  Component Value Date   WBC 9.3 11/23/2018   HGB 10.2 (L) 11/23/2018   HCT 30.0 (L) 11/23/2018   MCV 89.3 11/23/2018   PLT 283 11/23/2018   Lab Results  Component Value Date   NA 134 (L) 11/23/2018   K 4.0 11/23/2018   CL 94 (L) 11/23/2018   CO2 28 11/23/2018   CREATININE 0.84 11/23/2018   GLUCOSE 150 (H) 11/23/2018   Diagnostic Studies: Dg Chest 2  View  Result Date: 11/21/2018 CLINICAL DATA:  Pleural effusion. Chest soreness after CABG. EXAM: CHEST - 2 VIEW COMPARISON:  Chest x-rays dated 11/20/2018 11/19/2018. FINDINGS: Heart size and mediastinal contours are within normal limits. Median sternotomy wires appear intact and stable in alignment. Probable atelectasis and/or small pleural effusion at the LEFT lung base, stable. Lungs otherwise clear. No pneumothorax seen. Degenerative spondylosis throughout the kyphotic thoracolumbar spine, mild to moderate in degree. No acute or suspicious osseous finding. IMPRESSION: Probable atelectasis and/or small pleural effusion at the LEFT lung base, similar to recent exams. No evidence of pneumonia or pulmonary edema. Electronically Signed   By: Bary Richard M.D.   On: 11/21/2018 10:45   Dg Abd Portable 1v  Result Date: 11/18/2018 CLINICAL DATA:  Orogastric tube placement. EXAM: PORTABLE ABDOMEN - 1 VIEW COMPARISON:  05/20/2019. FINDINGS: Orogastric tube noted with its tip over the upper stomach. Theone Murdoch catheter noted. Mediastinal drainage tubing and chest tubes noted. Epicardial pacing wires noted. No gastric or bowel distention. Stool noted throughout the colon. No free air. IMPRESSION: Orogastric tube noted with tip and side hole over the stomach. No gastric or bowel distention. Electronically Signed   By: Maisie Fus  Register   On: 11/18/2018 16:17   Vas US Doppler Pre Cabg  Result Date: 11/17/2018 PREOPERATIVE VASCULAR EVALUATION  Indications:  Pre CABG. Risk Factors: Diabetes, coronary artery disease. Performing Technologist: Milta Deiters, [place] [facility]  Examination Guidelines: A complete evaluation includes B-mode imaging, spectral Doppler, color Doppler, and power Doppler as needed of all accessible portions of each vessel. Bilateral testing is considered an integral part of a complete examination. Limited examinations for reoccurring indications may be performed as noted.  Right Carotid Findings:  +----------+--------+--------+--------+--------+------------------+           PSV cm/sEDV cm/sStenosisDescribeComments           +----------+--------+--------+--------+--------+------------------+ CCA Prox  137     18                      intimal thickening +----------+--------+--------+--------+--------+------------------+ CCA Distal88      17                      intimal thickening +----------+--------+--------+--------+--------+------------------+ ICA Prox  68      12                      intimal thickening +----------+--------+--------+--------+--------+------------------+ ICA Mid   64      22                                         +----------+--------+--------+--------+--------+------------------+ ICA Distal87      29                                         +----------+--------+--------+--------+--------+------------------+  ECA       102     3                                          +----------+--------+--------+--------+--------+------------------+ Portions of this table do not appear on this page. +----------+--------+-------+--------+------------+           PSV cm/sEDV cmsDescribeArm Pressure +----------+--------+-------+--------+------------+ Subclavian78                     137          +----------+--------+-------+--------+------------+ +---------+--------+--+--------+--+ VertebralPSV cm/s44EDV cm/s13 +---------+--------+--+--------+--+ Left Carotid Findings: +----------+--------+--------+--------+------------+------------------+           PSV cm/sEDV cm/sStenosisDescribe    Comments           +----------+--------+--------+--------+------------+------------------+ CCA Prox  106     20                          intimal thickening +----------+--------+--------+--------+------------+------------------+ CCA Distal77      18                          intimal thickening  +----------+--------+--------+--------+------------+------------------+ ICA Prox  74      13              heterogenousmild plaque        +----------+--------+--------+--------+------------+------------------+ ICA Mid   88      33              heterogenousminimal plaque     +----------+--------+--------+--------+------------+------------------+ ICA Distal82      23                                             +----------+--------+--------+--------+------------+------------------+ ECA       120     16              heterogenousmild plaque        +----------+--------+--------+--------+------------+------------------+ +----------+--------+--------+--------+------------+ SubclavianPSV cm/sEDV cm/sDescribeArm Pressure +----------+--------+--------+--------+------------+           111                     134          +----------+--------+--------+--------+------------+ +---------+--------+--+--------+--+ VertebralPSV cm/s53EDV cm/s12 +---------+--------+--+--------+--+  ABI Findings: +---------+------------------+-----+---------+--------+ Right    Rt Pressure (mmHg)IndexWaveform Comment  +---------+------------------+-----+---------+--------+ Brachial 137                    triphasic         +---------+------------------+-----+---------+--------+ PTA      199               1.45 triphasic         +---------+------------------+-----+---------+--------+ DP       172               1.26 triphasic         +---------+------------------+-----+---------+--------+ Great Toe127               0.93                   +---------+------------------+-----+---------+--------+ +---------+------------------+-----+---------+-------+ Left     Lt Pressure (mmHg)IndexWaveform Comment +---------+------------------+-----+---------+-------+ Brachial 134  triphasic        +---------+------------------+-----+---------+-------+ PTA      192                1.40 triphasic        +---------+------------------+-----+---------+-------+ DP       195                    triphasic        +---------+------------------+-----+---------+-------+ Great Toe127               0.93                  +---------+------------------+-----+---------+-------+ +-------+---------------+----------------+ ABI/TBIToday's ABI/TBIPrevious ABI/TBI +-------+---------------+----------------+ Right  1.45/0.93                       +-------+---------------+----------------+ Left   1.42/0.93                       +-------+---------------+----------------+  Right Doppler Findings: +-----------+--------+-----+---------+--------+ Site       PressureIndexDoppler  Comments +-----------+--------+-----+---------+--------+ Brachial   137          triphasic         +-----------+--------+-----+---------+--------+ Radial                  triphasic         +-----------+--------+-----+---------+--------+ Ulnar                   triphasic         +-----------+--------+-----+---------+--------+ Palmar Arch                      normal   +-----------+--------+-----+---------+--------+  Left Doppler Findings: +-----------+--------+-----+---------+--------+ Site       PressureIndexDoppler  Comments +-----------+--------+-----+---------+--------+ Brachial   134          triphasic         +-----------+--------+-----+---------+--------+ Radial                  triphasic         +-----------+--------+-----+---------+--------+ Ulnar                   triphasic         +-----------+--------+-----+---------+--------+ Palmar Arch                      normal   +-----------+--------+-----+---------+--------+  Summary: Right Carotid: Velocities in the right ICA are consistent with a 1-39% stenosis. Left Carotid: Velocities in the left ICA are consistent with a 1-39% stenosis. Vertebrals:  Bilateral vertebral arteries demonstrate antegrade  flow. Subclavians: Normal flow hemodynamics were seen in bilateral subclavian              arteries. Right ABI: Resting right ankle-brachial index indicates noncompressible right lower extremity arteries.The right toe-brachial index is normal. Left ABI: Resting left ankle-brachial index indicates noncompressible left lower extremity arteries.The left toe-brachial index is normal. Right Upper Extremity: Doppler waveforms remain within normal limits with right radial compression. Doppler waveforms remain within normal limits with right ulnar compression. Left Upper Extremity: Doppler waveforms remain within normal limits with left radial compression. Doppler waveforms remain within normal limits with left ulnar compression.  Electronically signed by Lemar Livings MD on 11/17/2018 at 10:30:56 AM.    Final     Discharge Medications: Allergies as of 11/23/2018   No Known Allergies     Medication List    TAKE these medications  aspirin 81 MG EC tablet Take 1 tablet (81 mg total) by mouth daily.   atorvastatin 80 MG tablet Commonly known as:  LIPITOR Take 1 tablet (80 mg total) by mouth daily at 6 PM. What changed:    medication strength  how much to take  when to take this   carvedilol 3.125 MG tablet Commonly known as:  COREG Take 1 tablet (3.125 mg total) by mouth 2 (two) times daily with a meal.   clopidogrel 75 MG tablet Commonly known as:  PLAVIX Take 1 tablet (75 mg total) by mouth daily.   furosemide 40 MG tablet Commonly known as:  LASIX Take 1 tablet (40 mg total) by mouth daily. For 5 days then stop.   glimepiride 4 MG tablet Commonly known as:  AMARYL Take 6 mg by mouth daily before breakfast.   lisinopril 2.5 MG tablet Commonly known as:  PRINIVIL,ZESTRIL Take 1 tablet (2.5 mg total) by mouth daily. What changed:    medication strength  how much to take   metFORMIN 1000 MG tablet Commonly known as:  GLUCOPHAGE Take 1,000 mg by mouth 2 (two) times daily with a  meal.   oxyCODONE 5 MG immediate release tablet Commonly known as:  Oxy IR/ROXICODONE Take 5 mg by mouth every 4-6 hours PRN severe pain.   potassium chloride SA 20 MEQ tablet Commonly known as:  K-DUR,KLOR-CON Take 1 tablet (20 mEq total) by mouth once for 1 dose. For 5 days then stop.      The patient has been discharged on:   1.Beta Blocker:  Yes [ x  ]                              No   [   ]                              If No, reason:  2.Ace Inhibitor/ARB: Yes [ x  ]                                     No  [    ]                                     If No, reason:  3.Statin:   Yes [ x  ]                  No  [   ]                  If No, reason:  4.Ecasa:  Yes  [ x  ]                  No   [   ]                  If No, reason: Follow Up Appointments: Follow-up Information    Donata Clay, Theron Arista, MD. Go on 12/28/2018.   Specialty:  Cardiothoracic Surgery Why:  PA/LAT CXR to be taken (at [facility] which is in the same building as Dr. Zenaida Niece Trigt's office) on 12/28/2018 at 11:00 am;Appointment time is at 11:30 am Contact information: [address]  [place] [address] [phone]        Leone Brand, NP. Go on 12/06/2018.   Specialties:  Cardiology, Radiology Why:  Appointment time is at 11:00 am Contact information: [address] [place] [address] [phone]           Signed: Lelon Huh [facility] 11/23/2018, 10:38 AM   patient examined and medical record reviewed,agree with above note. Kathlee Nations Trigt III 12/13/2018

## 2018-11-21 NOTE — Progress Notes (Signed)
CARDIAC REHAB PHASE I   PRE:  Rate/Rhythm: 93 SR    BP: sitting 124/76    SaO2: 93 RA  MODE:  Ambulation: 580 ft   POST:  Rate/Rhythm: 108 ST    BP: sitting 127/88     SaO2: 90 RA  Pt needed reminders on sternal precautions. Seems he has been trying to be independent but using arms. Steady in hall with RW. SaO2 difficult to register and borderline at times. Was not using IS since being here so set it up for pt in recliner and now practicing, 1200 mL. Reminded pt on sternal precautions. Will need s/o for education.  [id number]   Harriet MassonRandi Kristan Jayle Solarz CES, ACSM 11/21/2018 9:55 AM

## 2018-11-21 NOTE — Discharge Instructions (Signed)
Discharge Instructions:  1. You may shower, please wash incisions daily with soap and water and keep dry.  If you wish to cover wounds with dressing you may do so but please keep clean and change daily.  No tub baths or swimming until incisions have completely healed.  If your incisions become red or develop any drainage please call our office at [phone]  2. No Driving until cleared by Dr. Zenaida NieceVan Trigt's office and you are no longer using narcotic pain medications  3. Monitor your weight daily.. Please use the same scale and weigh at same time... If you gain 5-10 lbs in 48 hours with associated lower extremity swelling, please contact our office at [phone]  4. Fever of 101.5 for at least 24 hours with no source, please contact our office at [phone]   Coronary Artery Bypass Grafting, Care After These instructions give you information on caring for yourself after your procedure. Your doctor may also give you more specific instructions. Call your doctor if you have any problems or questions after your procedure. Follow these instructions at home:  Only take medicine as told by your doctor. Take medicines exactly as told. Do not stop taking medicines or start any new medicines without talking to your doctor first.  Take your pulse as told by your doctor.  Do deep breathing as told by your doctor. Use your breathing device (incentive spirometer), if given, to practice deep breathing several times a day. Support your chest with a pillow or your arms when you take deep breaths or cough.  Keep the area clean, dry, and protected where the surgery cuts (incisions) were made. Remove bandages (dressings) only as told by your doctor. If strips were applied to surgical area, do not take them off. They fall off on their own.  Check the surgery area daily for puffiness (swelling), redness, or leaking fluid.  If surgery cuts were made in your legs: ? Avoid crossing your legs. ? Avoid sitting  for long periods of time. Change positions every 30 minutes. ? Raise your legs when you are sitting. Place them on pillows.  Wear stockings that help keep blood clots from forming in your legs (compression stockings).  Only take sponge baths until your doctor says it is okay to take showers. Pat the surgery area dry. Do not rub the surgery area with a washcloth or towel. Do not bathe, swim, or use a hot tub until your doctor says it is okay.  Eat foods that are high in fiber. These include raw fruits and vegetables, whole grains, beans, and nuts. Choose lean meats. Avoid canned, processed, and fried foods.  Drink enough fluids to keep your pee (urine) clear or pale yellow.  Weigh yourself every day.  Rest and limit activity as told by your doctor. You may be told to: ? Stop any activity if you have chest pain, shortness of breath, changes in heartbeat, or dizziness. Get help right away if this happens. ? Move around often for short amounts of time or take short walks as told by your doctor. Gradually become more active. You may need help to strengthen your muscles and build endurance. ? Avoid lifting, pushing, or pulling anything heavier than 10 pounds (4.5 kg) for at least 6 weeks after surgery.  Do not drive until your doctor says it is okay.  Ask your doctor when you can go back to work.  Ask your doctor when you can begin sexual activity again.  Follow up with your doctor  as told. Contact a doctor if:  You have puffiness, redness, more pain, or fluid draining from the incision site.  You have a fever.  You have puffiness in your ankles or legs.  You have pain in your legs.  You gain 2 or more pounds (0.9 kg) a day.  You feel sick to your stomach (nauseous) or throw up (vomit).  You have watery poop (diarrhea). Get help right away if:  You have chest pain that goes to your jaw or arms.  You have shortness of breath.  You have a fast or irregular heartbeat.  You  notice a "clicking" in your breastbone when you move.  You have numbness or weakness in your arms or legs.  You feel dizzy or light-headed. This information is not intended to replace advice given to you by your health care provider. Make sure you discuss any questions you have with your health care provider. Document Released: 12/12/2013 Document Revised: 05/14/2016 Document Reviewed: 05/16/2013 Elsevier Interactive Patient Education  2017 [employer]   5. Activity- up as tolerated, please walk at least 3 times per day.  Avoid strenuous activity, no lifting, pushing, or pulling with your arms over 8-10 lbs for a minimum of 6 weeks  6. If any questions or concerns arise, please do not hesitate to contact our office at [phone]

## 2018-11-21 NOTE — Progress Notes (Addendum)
      [address]       [place] [id number]             [phone]        3 Days Post-Op Procedure(s) (LRB): CORONARY ARTERY BYPASS GRAFTING (CABG) TIMES FIVE USING LEFT INTERNAL MAMMARY ARTERY AND RIGHT GREATER SAPHENOUS VEIN HARVESTED ENDOSCOPICALLY. (N/A) TRANSESOPHAGEAL ECHOCARDIOGRAM (TEE) (N/A)  Subjective: Patient sitting in chair without complaints. He has not had a bowel movement  Objective: Vital signs in last 24 hours: Temp:  [97.7 F (36.5 C)-99.1 F (37.3 C)] 98.8 F (37.1 C) (12/02 0434) Pulse Rate:  [92-110] 94 (12/02 0434) Cardiac Rhythm: Normal sinus rhythm (12/01 1931) Resp:  [13-31] 20 (12/02 0434) BP: (103-131)/(64-78) 125/78 (12/02 0434) SpO2:  [93 %-98 %] 95 % (12/02 0434) Weight:  [81.4 kg] 81.4 kg (12/02 0434)  Pre op weight 76.2 kg Current Weight  11/21/18 81.4 kg       Intake/Output from previous day: 12/01 0701 - 12/02 0700 In: 785.6 [P.O.:780; I.V.:5.6] Out: 1400 [Urine:1400]   Physical Exam:  Cardiovascular: RRR Pulmonary: Slightly diminished bases Abdomen: Soft, non tender, bowel sounds present. Extremities: Trace bilateral lower extremity edema. Ecchymosis right thigh Wounds: Aquacel removed and sternal wound is clean and dry.  No erythema or signs of infection. RLE wounds are clean and dry  Lab Results: CBC: Recent Labs    11/20/18 0500 11/21/18 0304  WBC 13.0* 12.1*  HGB 9.8* 9.2*  HCT 30.4* 27.9*  PLT 167 192   BMET:  Recent Labs    11/20/18 0500 11/21/18 0304  NA 133* 134*  K 3.9 3.7  CL 98 98  CO2 26 31  GLUCOSE 127* 123*  BUN 15 16  CREATININE 0.83 0.81  CALCIUM 7.8* 7.8*    PT/INR:  Lab Results  Component Value Date   INR 1.34 11/18/2018   INR 1.13 11/17/2018   INR 1.1 05/19/2009   ABG:  INR: Will add last result for INR, ABG once components are confirmed Will add last 4 CBG results once components are confirmed  Assessment/Plan:  1. CV - S/p STEMI. SR in the 90's. On  Coreg 3.125 mg bid and Plavix 75 mg daily. Will start low dose Lisinopril 2.  Pulmonary - On 2 liters of oxygen via [place]. Wean as able. Encourage incentive spirometer. 3. Volume Overload - Will give Lasix 40 mg daily 4.  Acute blood loss anemia - H and H this am 9.2 and 27.9 5. Supplement potassium 6. DM-CBGs 110/217/94. On Insulin. He was on Metformin 1000 mg daily and Glimeperide 6 mg daily so will restart low doses.Pre op HGA1C 7.6. 7. Remove EPW 8. LOC constipation 9. Home 1-2 days   Donielle M ZimmermanPA-C 11/21/2018,7:36 AM [phone]  Patient progressing after CABG following large lateral MI Needs more diuresis before DC  patient examined and medical record reviewed,agree with above note. Kathlee Nationseter Van Trigt III 11/21/2018

## 2018-11-21 NOTE — Progress Notes (Signed)
Pacer wires removed and intact. Pt tolerated well. Vitals monitored q 15. Lacy DuverneyJennifer Weldon Nouri, RN

## 2018-11-21 NOTE — Telephone Encounter (Signed)
Patient's family member, Twyla contacted the office concerned about Mr. Philip [name].  She stated she had not been able to reach him, wanted reassurance that everything was ok, and reached out to the nurse taking care of him.  She was unable to get information on him at that time.  She stated that she thought Mr. Philip [name] was going to be in the hospital longer than what is currently planned and there would not be anyone available to take care of him at home.  I advised that generally if a patient is progressing/ healing well they may go home sooner, however I was not at the hospital and I do not know the circumstances.  I did advised that she could contact the hospital again.  She acknowledged receipt.

## 2018-11-22 LAB — COMPREHENSIVE METABOLIC PANEL
ALT: 23 U/L (ref 0–44)
AST: 25 U/L (ref 15–41)
Albumin: 3 g/dL — ABNORMAL LOW (ref 3.5–5.0)
Alkaline Phosphatase: 64 U/L (ref 38–126)
Anion gap: 11 (ref 5–15)
BUN: 15 mg/dL (ref 8–23)
CO2: 27 mmol/L (ref 22–32)
Calcium: 8.3 mg/dL — ABNORMAL LOW (ref 8.9–10.3)
Chloride: 95 mmol/L — ABNORMAL LOW (ref 98–111)
Creatinine, Ser: 1.02 mg/dL (ref 0.61–1.24)
GFR calc Af Amer: >60 mL/min (ref >60)
GFR calc non Af Amer: >60 mL/min (ref >60)
Glucose, Bld: 241 mg/dL — ABNORMAL HIGH (ref 70–99)
Potassium: 3.7 mmol/L (ref 3.5–5.1)
Sodium: 133 mmol/L — ABNORMAL LOW (ref 135–145)
Total Bilirubin: 1.1 mg/dL (ref 0.3–1.2)
Total Protein: 6.3 g/dL — ABNORMAL LOW (ref 6.5–8.1)

## 2018-11-22 LAB — GLUCOSE, CAPILLARY: GLUCOSE-CAPILLARY: 202 mg/dL — AB (ref 70–99)

## 2018-11-22 LAB — CBC
HCT: 29.8 % — ABNORMAL LOW (ref 39.0–52.0)
Hemoglobin: 9.9 g/dL — ABNORMAL LOW (ref 13.0–17.0)
MCH: 30 pg (ref 26.0–34.0)
MCHC: 33.2 g/dL (ref 30.0–36.0)
MCV: 90.3 fL (ref 80.0–100.0)
Platelets: 282 10*3/uL (ref 150–400)
RBC: 3.3 MIL/uL — ABNORMAL LOW (ref 4.22–5.81)
RDW: 11.4 % — ABNORMAL LOW (ref 11.5–15.5)
WBC: 12 10*3/uL — ABNORMAL HIGH (ref 4.0–10.5)
nRBC: 0 % (ref 0.0–0.2)

## 2018-11-22 MED ORDER — CLOPIDOGREL BISULFATE 75 MG PO TABS: 75.0000 mg | ORAL_TABLET | Freq: Every day | ORAL | 1 refills | Status: DC

## 2018-11-22 MED ORDER — LISINOPRIL 2.5 MG PO TABS: 2.5000 mg | ORAL_TABLET | Freq: Every day | ORAL | 1 refills | Status: DC

## 2018-11-22 MED ORDER — FUROSEMIDE 40 MG PO TABS: 40.0000 mg | ORAL_TABLET | Freq: Every day | ORAL | 0 refills | Status: DC

## 2018-11-22 MED ORDER — OXYCODONE HCL 5 MG PO TABS: ORAL_TABLET | ORAL | 0 refills | Status: DC

## 2018-11-22 MED ORDER — POTASSIUM CHLORIDE CRYS ER 20 MEQ PO TBCR
30.0000 meq | EXTENDED_RELEASE_TABLET | Freq: Once | ORAL | Status: AC
  Administered 2018-11-22: 30 meq via ORAL
  Filled 2018-11-22: qty 1

## 2018-11-22 MED ORDER — GLIMEPIRIDE 4 MG PO TABS
4.0000 mg | ORAL_TABLET | Freq: Every day | ORAL | Status: DC
  Administered 2018-11-22 – 2018-11-23 (×2): 4 mg via ORAL
  Filled 2018-11-22 (×2): qty 1

## 2018-11-22 MED ORDER — ATORVASTATIN CALCIUM 80 MG PO TABS: 80.0000 mg | ORAL_TABLET | Freq: Every day | ORAL | 1 refills | Status: DC

## 2018-11-22 MED ORDER — CARVEDILOL 3.125 MG PO TABS: 3.1250 mg | ORAL_TABLET | Freq: Two times a day (BID) | ORAL | 1 refills | Status: DC

## 2018-11-22 MED ORDER — ASPIRIN 81 MG PO TBEC: 81.0000 mg | DELAYED_RELEASE_TABLET | Freq: Every day | ORAL | Status: AC

## 2018-11-22 MED ORDER — POTASSIUM CHLORIDE CRYS ER 20 MEQ PO TBCR: 20.0000 meq | EXTENDED_RELEASE_TABLET | Freq: Once | ORAL | 0 refills | Status: DC

## 2018-11-22 MED FILL — Lidocaine HCl(Cardiac) IV PF Soln Pref Syr 100 MG/5ML (2%): INTRAVENOUS | Qty: 5 | Status: AC

## 2018-11-22 MED FILL — Heparin Sodium (Porcine) Inj 1000 Unit/ML: INTRAMUSCULAR | Qty: 30 | Status: AC

## 2018-11-22 MED FILL — Heparin Sodium (Porcine) Inj 1000 Unit/ML: INTRAMUSCULAR | Qty: 40 | Status: AC

## 2018-11-22 MED FILL — Calcium Chloride Inj 10%: INTRAVENOUS | Qty: 10 | Status: AC

## 2018-11-22 MED FILL — Potassium Chloride Inj 2 mEq/ML: INTRAVENOUS | Qty: 40 | Status: AC

## 2018-11-22 MED FILL — Electrolyte-R (PH 7.4) Solution: INTRAVENOUS | Qty: 3000 | Status: AC

## 2018-11-22 MED FILL — Mannitol IV Soln 20%: INTRAVENOUS | Qty: 500 | Status: AC

## 2018-11-22 MED FILL — Sodium Bicarbonate IV Soln 8.4%: INTRAVENOUS | Qty: 50 | Status: AC

## 2018-11-22 MED FILL — Magnesium Sulfate Inj 50%: INTRAMUSCULAR | Qty: 10 | Status: AC

## 2018-11-22 MED FILL — Sodium Chloride IV Soln 0.9%: INTRAVENOUS | Qty: 2000 | Status: AC

## 2018-11-22 MED FILL — POTASSIUM CL ER 20 MEQ TABL: 20 | 5 days supply | Qty: 5 | Fill #0

## 2018-11-22 MED FILL — FUROSEMIDE 40 MG TABLET: 40 | 5 days supply | Qty: 5 | Fill #0

## 2018-11-22 MED FILL — oxyCODONE HCL 5 MG TABS: 5 | 5 days supply | Qty: 30 | Fill #0

## 2018-11-22 MED FILL — CLOPIDOGREL 75 MG TABLET: 75 | 30 days supply | Qty: 30 | Fill #0

## 2018-11-22 MED FILL — CARVEDILOL 3.125 MG TABLET: 3.125 | 30 days supply | Qty: 60 | Fill #0

## 2018-11-22 MED FILL — ATORVASTATIN CALCIUM 80 MG: 80 | 30 days supply | Qty: 30 | Fill #0

## 2018-11-22 MED FILL — LISINOPRIL 2.5 MG TABLET: 2.5 | 30 days supply | Qty: 30 | Fill #0

## 2018-11-22 NOTE — Progress Notes (Signed)
Spoke with Twyla pt's significant other. She advises she may be able to pick pt up around 11:30. Pt still may not have anyone to stay with him for 12hrs a day. Also has 2 large dogs that may jump on pt. She is concerned about him coming home. Her number is [phone] Lacy DuverneyJennifer Berneta Sconyers, RN

## 2018-11-22 NOTE — Progress Notes (Signed)
CARDIAC REHAB PHASE I   PRE:  Rate/Rhythm: 83 SR    BP: sitting 136/84    SaO2: 93 RA  MODE:  Ambulation: 790 ft   POST:  Rate/Rhythm: 88 SR    BP: sitting 140/82     SaO2: 95 RA  Pt got out of bed independently and ambulated with RW. Tried 100 ft without RW but felt more secure with it. Has a slight limp when not using RW. Increased distance, no c/o. To recliner. Pt strained arms sitting in recliner, discussed this again. Flat affect. Encouraged pt to ask daughter to be with him when his girlfriend is working at d/c. [phone]   Harriet MassonRandi Kristan Damonica Chopra CES, ACSM 11/22/2018 9:43 AM

## 2018-11-22 NOTE — Progress Notes (Addendum)
      [address]       [place] [id number]             [phone]        4 Days Post-Op Procedure(s) (LRB): CORONARY ARTERY BYPASS GRAFTING (CABG) TIMES FIVE USING LEFT INTERNAL MAMMARY ARTERY AND RIGHT GREATER SAPHENOUS VEIN HARVESTED ENDOSCOPICALLY. (N/A) TRANSESOPHAGEAL ECHOCARDIOGRAM (TEE) (N/A)  Subjective: Patient had a bowel movement yesterday.  Objective: Vital signs in last 24 hours: Temp:  [97.2 F (36.2 C)-98.2 F (36.8 C)] 98.2 F (36.8 C) (12/03 0432) Pulse Rate:  [87-99] 95 (12/02 2352) Cardiac Rhythm: Normal sinus rhythm (12/02 1900) Resp:  [14-23] 17 (12/03 0432) BP: (106-141)/(55-74) 106/61 (12/03 0432) SpO2:  [89 %-100 %] 96 % (12/03 0432) Weight:  [78.1 kg] 78.1 kg (12/03 0433)  Pre op weight 76.2 kg Current Weight  11/22/18 78.1 kg      Intake/Output from previous day: 12/02 0701 - 12/03 0700 In: -  Out: 500 [Urine:500]   Physical Exam:  Cardiovascular: RRR Pulmonary: Slightly diminished bases Abdomen: Soft, non tender, bowel sounds present. Extremities: Trace bilateral lower extremity edema. Ecchymosis right thigh Wounds: Sternal wound is clean and dry.  No erythema or signs of infection. RLE wounds are clean and dry  Lab Results: CBC: Recent Labs    11/21/18 0304 11/22/18 0316  WBC 12.1* 12.0*  HGB 9.2* 9.9*  HCT 27.9* 29.8*  PLT 192 282   BMET:  Recent Labs    11/21/18 0304 11/22/18 0316  NA 134* 133*  K 3.7 3.7  CL 98 95*  CO2 31 27  GLUCOSE 123* 241*  BUN 16 15  CREATININE 0.81 1.02  CALCIUM 7.8* 8.3*    PT/INR:  Lab Results  Component Value Date   INR 1.34 11/18/2018   INR 1.13 11/17/2018   INR 1.1 05/19/2009   ABG:  INR: Will add last result for INR, ABG once components are confirmed Will add last 4 CBG results once components are confirmed  Assessment/Plan:  1. CV - S/p STEMI. SR in the 90's. On Coreg 3.125 mg bid, Lisinopril 2.5 mg daily, and Plavix 75 mg daily.  2.  Pulmonary -  On room air. Encourage incentive spirometer. 3. Volume Overload - Will give Lasix 40 mg daily 4.  Acute blood loss anemia - Last H and H  9.2 and 27.9 5. Supplement potassium 6. DM-CBGs 94/175/202. On Insulin. He was on Metformin  850 mg daily and Glimeperide 2 mg daily so will increase Glimepiride to 4 mg daily. Pre op HGA1C 7.6. 7. Regarding disposition, patient had large MI prior to surgery. As discussed with Dr. Donata ClayVan Trigt, will keep until am  Lelon HuhDonielle M [facility] CenterZimmermanPA-C 11/22/2018,7:14 AM [phone]  Patient needs more diuresis and medication titration--plan discharge tomorrow.  I discussed discharge instructions including the importance of heart healthy diet and heart healthy lifestyle with the patient.

## 2018-11-22 NOTE — Progress Notes (Signed)
PT incisions cleaned with Betadine. Chest tube sutures removed. Pt tolerated well. Philip DuverneyJennifer Anniece Bleiler

## 2018-11-23 LAB — GLUCOSE, CAPILLARY
Glucose-Capillary: 119 mg/dL — ABNORMAL HIGH (ref 70–99)
Glucose-Capillary: 147 mg/dL — ABNORMAL HIGH (ref 70–99)

## 2018-11-23 LAB — COMPREHENSIVE METABOLIC PANEL
ALT: 27 U/L (ref 0–44)
AST: 36 U/L (ref 15–41)
Albumin: 2.8 g/dL — ABNORMAL LOW (ref 3.5–5.0)
Alkaline Phosphatase: 67 U/L (ref 38–126)
Anion gap: 12 (ref 5–15)
BUN: 11 mg/dL (ref 8–23)
CO2: 28 mmol/L (ref 22–32)
Calcium: 8.7 mg/dL — ABNORMAL LOW (ref 8.9–10.3)
Chloride: 94 mmol/L — ABNORMAL LOW (ref 98–111)
Creatinine, Ser: 0.84 mg/dL (ref 0.61–1.24)
GFR calc Af Amer: >60 mL/min (ref >60)
GFR calc non Af Amer: >60 mL/min (ref >60)
Glucose, Bld: 150 mg/dL — ABNORMAL HIGH (ref 70–99)
Potassium: 4 mmol/L (ref 3.5–5.1)
Sodium: 134 mmol/L — ABNORMAL LOW (ref 135–145)
Total Bilirubin: 0.8 mg/dL (ref 0.3–1.2)
Total Protein: 6.4 g/dL — ABNORMAL LOW (ref 6.5–8.1)

## 2018-11-23 LAB — CBC
HCT: 30 % — ABNORMAL LOW (ref 39.0–52.0)
Hemoglobin: 10.2 g/dL — ABNORMAL LOW (ref 13.0–17.0)
MCH: 30.4 pg (ref 26.0–34.0)
MCHC: 34 g/dL (ref 30.0–36.0)
MCV: 89.3 fL (ref 80.0–100.0)
Platelets: 283 10*3/uL (ref 150–400)
RBC: 3.36 MIL/uL — ABNORMAL LOW (ref 4.22–5.81)
RDW: 11.3 % — ABNORMAL LOW (ref 11.5–15.5)
WBC: 9.3 10*3/uL (ref 4.0–10.5)
nRBC: 0 % (ref 0.0–0.2)

## 2018-11-23 MED ORDER — INSULIN ASPART 100 UNIT/ML ~~LOC~~ SOLN: 0.0000 [IU] | Freq: Three times a day (TID) | SUBCUTANEOUS | Status: DC

## 2018-11-23 NOTE — Progress Notes (Signed)
D/C instructions and prescription for oxycodone given to pt. IV removed, clean and intact. Telemetry removed. Wound care and sternal precautions given to pt. Girlfriend to escort home. Versie StarksHanna  Huey Scalia, RN

## 2018-11-23 NOTE — Care Management Note (Signed)
Case Management Note Donn PieriniKristi Karmyn Lowman RN, BSN Transitions of Care Unit 4E- RN Case Manager [phone]  Patient Details  Name: Philip [name] MRN: [id number] Date of Birth: 1956/06/11  Subjective/Objective:    Pt admitted with STEMI, s/p CABG                 Action/Plan: PTA Pt lived at home with girlfriend. [facility] and [facility] meds to bedside prior to discharge. Per conversation with pt on 12/3 pt has drug plan with [facility] and has friends that are going to check on him along with his girlfriend calling while she is at work during the day.   Expected Discharge Date:  11/23/18               Expected Discharge Plan:  Home/Self Care  In-House Referral:  NA  Discharge planning Services  CM Consult  Post Acute Care Choice:  NA Choice offered to:  NA  DME Arranged:    DME Agency:     HH Arranged:    HH Agency:     Status of Service:  Completed, signed off  If discussed at Long Length of Stay Meetings, dates discussed:    Discharge Disposition: home/self care  Additional Comments:  Darrold SpanWebster, Sophiamarie Nease Hall, RN 11/23/2018, 11:35 AM

## 2018-11-23 NOTE — Progress Notes (Addendum)
      [address]       [place] [id number]             [phone]        5 Days Post-Op Procedure(s) (LRB): CORONARY ARTERY BYPASS GRAFTING (CABG) TIMES FIVE USING LEFT INTERNAL MAMMARY ARTERY AND RIGHT GREATER SAPHENOUS VEIN HARVESTED ENDOSCOPICALLY. (N/A) TRANSESOPHAGEAL ECHOCARDIOGRAM (TEE) (N/A)  Subjective: Patient without complaints. He wants to go home.  Objective: Vital signs in last 24 hours: Temp:  [98.1 F (36.7 C)-98.8 F (37.1 C)] 98.1 F (36.7 C) (12/04 0431) Pulse Rate:  [83-89] 89 (12/03 2000) Cardiac Rhythm: Normal sinus rhythm (12/03 1900) Resp:  [17-25] 23 (12/04 0444) BP: (110-140)/(56-84) 112/70 (12/04 0431) SpO2:  [96 %] 96 % (12/03 0930) Weight:  [79 kg] 79 kg (12/04 0444)  Pre op weight 76.2 kg Current Weight  11/23/18 79 kg      Intake/Output from previous day: 12/03 0701 - 12/04 0700 In: 120 [P.O.:120] Out: 600 [Urine:600]   Physical Exam:  Cardiovascular: RRR Pulmonary: Slightly diminished bases Abdomen: Soft, non tender, bowel sounds present. Extremities: Trace bilateral lower extremity edema. Ecchymosis right thigh Wounds: Sternal wound is clean and dry.  No erythema or signs of infection. RLE wounds are clean and dry  Lab Results: CBC: Recent Labs    11/22/18 0316 11/23/18 0225  WBC 12.0* 9.3  HGB 9.9* 10.2*  HCT 29.8* 30.0*  PLT 282 283   BMET:  Recent Labs    11/22/18 0316 11/23/18 0225  NA 133* 134*  K 3.7 4.0  CL 95* 94*  CO2 27 28  GLUCOSE 241* 150*  BUN 15 11  CREATININE 1.02 0.84  CALCIUM 8.3* 8.7*    PT/INR:  Lab Results  Component Value Date   INR 1.34 11/18/2018   INR 1.13 11/17/2018   INR 1.1 05/19/2009   ABG:  INR: Will add last result for INR, ABG once components are confirmed Will add last 4 CBG results once components are confirmed  Assessment/Plan:  1. CV - S/p STEMI. SR in the 90's. On Coreg 3.125 mg bid, Lisinopril 2.5 mg daily, and Plavix 75 mg daily.  2.   Pulmonary - On room air. Encourage incentive spirometer. 3. Volume Overload - On Lasix 40 mg daily 4.  Acute blood loss anemia - H and H this am stable at  10.2 and 30 5. Supplement potassium 6. DM-CBGs 94/175/202. On Insulin. He was on Metformin  850 mg daily and Glimeperide 4 mg daily .Pre op HGA1C 7.6. 7. . Patient states his girlfriend, Twyla, can call to check on him while she is at work. He has friends who can also check on him. Will likely discharge later today  Lelon HuhDonielle M [facility] CenterZimmermanPA-C 11/23/2018,7:12 AM [phone] Patient ready for discharge-discharge instructions reviewed with patient. Agree with above note and plan Lovett SoxPeter Hy Swiatek MD

## 2018-11-23 NOTE — Progress Notes (Signed)
Ed completed with pt. Voiced understanding. Gave Move in the Tube guidelines again for reminders. [facility]  [phone] Ethelda ChickKristan Devota Viruet CES, ACSM 11:11 AM 11/23/2018

## 2018-11-26 NOTE — Addendum Note (Signed)
Addendum  created 11/26/18 2120 by Dorris SinghGreen, Oday Ridings, MD   Sign clinical note

## 2018-12-05 NOTE — Progress Notes (Signed)
Cardiology Office Note   Date:  12/06/2018   ID:  Philip [name], DOB [birth year], MRN [id number]  PCP:  Elizabeth PalauAnderson, Teresa, FNP  Cardiologist:  Dr. Anne FuSkains    Chief Complaint  Patient presents with  . Hospitalization Follow-up      History of Present Illness: Philip [name] is a 62 y.o. male who presents for hospitalization follow up.   He has a hx of diabetes, hypertension, non-smoker with no early family history of coronary artery disease who began to have significant chest discomfort yesterday at approximately 2 PM that originally was intermittent.  He tried to burp to make it feel better.  Ended up going to sleep.  He woke up earlier than he usually does at approximately 3:30 AM and felt poor.  He fought through it and went to work and at 5:30 AM he was having significant chest discomfort and pressure with no radiation that almost put him to his knees he states.  His boss encouraged him to go to the doctor and he went to an urgent care who then transferred him or encouraged him to come to the emergency room.  He wanted to drive himself.  He states he had a bad experience once in an ambulance.  Troponin 17 in ER   Had Lt heart cath Successful PCI of the totally occluded circumflex vessel with PTCA alone with the 100% stenosis being reduced to 40% and TIMI 0 flow being improved to TIMI-3 flow.  There is mild residual thrombus at the site of initial occlusion but continued significant CAD - TCTS consult  CABG 11/18/18  X 5 (LIMA to LAD, SVG to DIAGONAL, SVG to OM, SVG SEQUENTIALLY to PDA and PLB  D/c 11/23/18  Pt did well post op, no atrial fib.  He is back for follow up without complaints though he does mention some DOE, he never had lasix filled due to insurance issue.  Other meds were filled.  CXR on 12.2.19 was stable with perhaps Lt lung pl effusion small.  No chest pain.  His appetite is good and he is walking for exercise.  He is not sure he wants to attend cardiac rehab.   encouraged to attend.  Anemia at discharge.  Past Medical History:  Diagnosis Date  . Diabetes mellitus without complication (HCC)   . Hyperlipidemia   . Hypertension     Past Surgical History:  Procedure Laterality Date  . CORONARY ARTERY BYPASS GRAFT N/A 11/18/2018   Procedure: CORONARY ARTERY BYPASS GRAFTING (CABG) TIMES FIVE USING LEFT INTERNAL MAMMARY ARTERY AND RIGHT GREATER SAPHENOUS VEIN HARVESTED ENDOSCOPICALLY.;  Surgeon: Kerin PernaVan Trigt, Peter, MD;  [facility];  Service: Open Heart Surgery;  Laterality: N/A;  . LEFT HEART CATH AND CORONARY ANGIOGRAPHY N/A 11/15/2018   Procedure: LEFT HEART CATH AND CORONARY ANGIOGRAPHY;  Surgeon: Lennette BihariKelly, Thomas A, MD;  Location: [facility] INVASIVE CV LAB;  Service: Cardiovascular;  Laterality: N/A;  . TEE WITHOUT CARDIOVERSION N/A 11/18/2018   Procedure: TRANSESOPHAGEAL ECHOCARDIOGRAM (TEE);  Surgeon: Donata ClayVan Trigt, Theron AristaPeter, MD;  [facility];  Service: Open Heart Surgery;  Laterality: N/A;     Current Outpatient Medications  Medication Sig Dispense Refill  . aspirin EC 81 MG EC tablet Take 1 tablet (81 mg total) by mouth daily.    Marland Kitchen. atorvastatin (LIPITOR) 80 MG tablet Take 1 tablet (80 mg total) by mouth daily at 6 PM. 30 tablet 1  . carvedilol (COREG) 3.125 MG tablet Take 1 tablet (3.125 mg total) by mouth  2 (two) times daily with a meal. 60 tablet 1  . clopidogrel (PLAVIX) 75 MG tablet Take 1 tablet (75 mg total) by mouth daily. 30 tablet 1  . glimepiride (AMARYL) 4 MG tablet Take 6 mg by mouth daily before breakfast.    . lisinopril (PRINIVIL,ZESTRIL) 2.5 MG tablet Take 1 tablet (2.5 mg total) by mouth daily. 30 tablet 1  . metFORMIN (GLUCOPHAGE) 1000 MG tablet Take 1,000 mg by mouth 2 (two) times daily with a meal.    . oxyCODONE (OXY IR/ROXICODONE) 5 MG immediate release tablet Take 5 mg by mouth every 4-6 hours PRN severe pain. 30 tablet 0   No current facility-administered medications for this visit.     Allergies:   Patient has no known  allergies.    Social History:  The patient  reports that he has never smoked. He has never used smokeless tobacco. He reports current alcohol use. He reports that he does not use drugs.   Family History:  The patient's family history is not on file.  No premature FH of CAD   ROS:  General:no colds or fevers, no weight changes Skin:no rashes or ulcers HEENT:no blurred vision, no congestion CV:see HPI PUL:see HPI GI:no diarrhea constipation or melena, no indigestion GU:no hematuria, no dysuria MS:no joint pain, no claudication Neuro:no syncope, no lightheadedness Endo:+ diabetes, no thyroid disease  Wt Readings from Last 3 Encounters:  12/06/18 170 lb 6.4 oz (77.3 kg)  11/23/18 174 lb 2.6 oz (79 kg)     PHYSICAL EXAM: VS:  BP 114/60   Pulse 92   Ht 5\' 11"  (1.803 m)   Wt 170 lb 6.4 oz (77.3 kg)   SpO2 97%   BMI 23.77 kg/m  , BMI Body mass index is 23.77 kg/m. General:Pleasant affect, NAD Skin:Warm and dry, brisk capillary refill HEENT:normocephalic, sclera clear, mucus membranes moist Neck:supple, no JVD, no bruits  Heart:S1S2 RRR without murmur, gallup, rub or click, chest incision healing. Lungs:clear without rales, rhonchi, or wheezes RUE:AVWU, non tender, + BS, do not palpate liver spleen or masses Ext:no lower ext edema, 2+ pedal pulses, 2+ radial pulses Neuro:alert and oriented X 3 MAE, follows commands, + facial symmetry    EKG:  EKG is ordered today. The ekg ordered today demonstrates SR with Q waves in inf leads with new inf wall MI no acute changes.     Recent Labs: 11/17/2018: TSH 1.740 11/19/2018: Magnesium 2.2 11/23/2018: ALT 27; BUN 11; Creatinine, Ser 0.84; Hemoglobin 10.2; Platelets 283; Potassium 4.0; Sodium 134    Lipid Panel    Component Value Date/Time   CHOL 95 11/16/2018 0651   TRIG 75 11/16/2018 0651   HDL 26 (L) 11/16/2018 0651   CHOLHDL 3.7 11/16/2018 0651   VLDL 15 11/16/2018 0651   LDLCALC 54 11/16/2018 0651       Other  studies Reviewed: Additional studies/ records that were reviewed today include: Cardiac cath 11/15/18.  Dist RCA lesion is 60% stenosed.  Ost RPDA lesion is 60% stenosed.  1st RPLB lesion is 90% stenosed.  2nd RPLB lesion is 85% stenosed.  Ost 1st Diag lesion is 95% stenosed.  1st Diag lesion is 60% stenosed.  Mid LAD lesion is 70% stenosed.  Prox Cx lesion is 100% stenosed.  Ost 2nd Mrg lesion is 70% stenosed.  Post intervention, there is a 40% residual stenosis.  The left ventricular ejection fraction is 50-55% by visual estimate.  LV end diastolic pressure is low.   Severe multivessel CAD with  a large twin like LAD/diagonal system with high-grade 95% stenosis at the ostium of the proximal diagonal arising from the LAD with mid narrowing of 60%, moderate irregularity of the LAD with area of ectasia in the mid segment and focal mid distal 70% stenosis; total proximal occlusion of the left circumflex coronary artery; irregular dominant RCA with area of aneurysmal ectasia in the region of the acute margin followed by 60% stenosis, and ostial narrowing of 3 major posterior branches of 60, 90 and 80%.  There is very faint collateralization of the OM branch and distal circumflex  from the distal RCA.  Low normal global LV function with an EF of 50 to 55% with subtle small region of mid anterolateral and mid posterior hypocontractility.  LVEDP 8 mm.  Successful PCI of the totally occluded circumflex vessel with PTCA alone with the 100% stenosis being reduced to 40% and TIMI 0 flow being improved to TIMI-3 flow.  There is mild residual thrombus at the site of initial occlusion.  RECOMMENDATION: Patient will be maintained on IV nitroglycerin currently at 30 mcg until CABG and will be started on low-dose beta-blocker therapy.  Bivalirudin will be continued for 4 hours post procedure and Aggrastat bolus plus infusion was initiated during the procedure and infusion should continue for  minimum of 18 hours or later.  I have contacted Dr. Dorris Fetch at the completion of the procedure who is aware of the patient.  At present the patient is hemodynamically stable.  Surgical consultation will be obtained in a.m. ECG post procedures shows small Q waves inferiorly with early transition compatible with inferior posterior MI without significant ST elevation.  Once Aggrastat is discontinued recommend heparinization until CABG revascularization. P2Y12 therapy was not administered due to need for CABG revascularization.  Echo 11/16/18 Study Conclusions  - Left ventricle: The cavity size was normal. Systolic function was   mildly reduced. The estimated ejection fraction was in the range   of 45% to 50%. Left ventricular diastolic function parameters   were normal. - Regional wall motion abnormality: Hypokinesis of the basal-mid   anterior, basal-mid inferolateral, and basal-mid anterolateral   myocardium. - Aortic valve: There was trivial regurgitation. - Mitral valve: There was trivial regurgitation. - Right ventricle: Systolic function was normal. - Tricuspid valve: There was trivial regurgitation. - Pulmonic valve: There was no significant regurgitation.  Impressions:  - Mildly reduced LV EF with hypokinesis of the basal to mid   anterior, anterolateral, and inferolateral walls.  Dopplers with carotids with bil. 1-39% stenosis   ASSESSMENT AND PLAN:  1. STEMI with PTCA to LCX and then with residual significant CAD CABG was done. Continue ASA and plavix  Follow up with Dr. Anne Fu in 6-8 weeks.  2.  HLD continue lipid, recheck hepatic and lipid in 6-8 weeks.   3.  Post op anemia check CBC  4. DOE improving but will call if increases  5. DM-2 per PCP should follow up with PCP in 3-4 weeks.  6.  HTN controlled.  Continue BB and lisinopril     Current medicines are reviewed with the patient today.  The patient Has no concerns regarding medicines.  The following  changes have been made:  See above Labs/ tests ordered today include:see above  Disposition:   FU:  see above  Signed, Nada Boozer, NP  12/06/2018 11:13 PM    [facility] [address] [place], [place], [place]  [address]/ [address] [place], [place] Phone: [phone])  [phone]; Fax: [phone]

## 2018-12-06 ENCOUNTER — Encounter: Payer: Self-pay | Admitting: Cardiology

## 2018-12-06 ENCOUNTER — Ambulatory Visit (INDEPENDENT_AMBULATORY_CARE_PROVIDER_SITE_OTHER): Payer: BLUE CROSS/BLUE SHIELD | Admitting: Cardiology

## 2018-12-06 VITALS — BP 114/60 | HR 92 | Ht 71.0 in | Wt 170.4 lb

## 2018-12-06 DIAGNOSIS — I10 Essential (primary) hypertension: Secondary | ICD-10-CM

## 2018-12-06 DIAGNOSIS — I2121 ST elevation (STEMI) myocardial infarction involving left circumflex coronary artery: Secondary | ICD-10-CM

## 2018-12-06 DIAGNOSIS — E782 Mixed hyperlipidemia: Secondary | ICD-10-CM

## 2018-12-06 DIAGNOSIS — E876 Hypokalemia: Secondary | ICD-10-CM | POA: Diagnosis not present

## 2018-12-06 DIAGNOSIS — Z951 Presence of aortocoronary bypass graft: Secondary | ICD-10-CM

## 2018-12-06 DIAGNOSIS — D649 Anemia, unspecified: Secondary | ICD-10-CM | POA: Diagnosis not present

## 2018-12-06 DIAGNOSIS — E119 Type 2 diabetes mellitus without complications: Secondary | ICD-10-CM

## 2018-12-06 NOTE — Anesthesia Procedure Notes (Addendum)
Central Venous Catheter Insertion Performed by: Dorris SinghGreen, Nolberto Cheuvront, MD, anesthesiologist Start/End11/29/2019 6:50 AM, 11/18/2018 7:05 AM Preanesthetic checklist: patient identified, IV checked, site marked, risks and benefits discussed, surgical consent, monitors and equipment checked, pre-op evaluation and timeout performed Position: Trendelenburg Lidocaine 1% used for infiltration and patient sedated Hand hygiene performed , maximum sterile barriers used  and Seldinger technique used Central line and PA cath was placed.Sheath introducer Procedure performed using ultrasound guided technique. Ultrasound Notes:anatomy identified and image(s) printed for medical record Attempts: 1 Following insertion, line sutured, dressing applied and Biopatch. Patient tolerated the procedure well with no immediate complications.

## 2018-12-06 NOTE — Addendum Note (Signed)
Addendum  created 12/06/18 1859 by Dorris SinghGreen, Eisley Barber, MD   Child order released for a procedure order, Clinical Note Signed, Intraprocedure Blocks edited, LDA created via procedure documentation

## 2018-12-06 NOTE — Patient Instructions (Signed)
Medication Instructions:  Your physician recommends that you continue on your current medications as directed. Please refer to the Current Medication list given to you today.  If you need a refill on your cardiac medications before your next appointment, please call your pharmacy.   Lab work: TODAY: BMET, CBC  If you have labs (blood work) drawn today and your tests are completely normal, you will receive your results only by: Marland Kitchen. MyChart Message (if you have MyChart) OR . A paper copy in the mail If you have any lab test that is abnormal or we need to change your treatment, we will call you to review the results.  Testing/Procedures: None  Follow-Up: At [facility], you and your health needs are our priority.  As part of our continuing mission to provide you with exceptional heart care, we have created designated Provider Care Teams.  These Care Teams include your primary Cardiologist (physician) and Advanced Practice Providers (APPs -  Physician Assistants and Nurse Practitioners) who all work together to provide you with the care you need, when you need it. You will need a follow up appointment in 6-8 weeks.  Please call our office 2 months in advance to schedule this appointment.  You may see Donato SchultzMark Skains, MD or one of the following Advanced Practice Providers on your designated Care Team:   Norma FredricksonLori Gerhardt, NP Nada BoozerLaura Ingold, NP . Georgie ChardJill McDaniel, NP  Any Other Special Instructions Will Be Listed Below (If Applicable).

## 2018-12-07 ENCOUNTER — Telehealth: Payer: Self-pay | Admitting: *Deleted

## 2018-12-07 LAB — CBC
HEMATOCRIT: 34 % — AB (ref 37.5–51.0)
HEMOGLOBIN: 10.9 g/dL — AB (ref 13.0–17.7)
MCH: 29.2 pg (ref 26.6–33.0)
MCHC: 32.1 g/dL (ref 31.5–35.7)
MCV: 91 fL (ref 79–97)
Platelets: 523 10*3/uL — ABNORMAL HIGH (ref 150–450)
RBC: 3.73 x10E6/uL — ABNORMAL LOW (ref 4.14–5.80)
RDW: 12.3 % (ref 12.3–15.4)
WBC: 7.8 10*3/uL (ref 3.4–10.8)

## 2018-12-07 LAB — BASIC METABOLIC PANEL
BUN/Creatinine Ratio: 15 (ref 10–24)
BUN: 11 mg/dL (ref 8–27)
CO2: 25 mmol/L (ref 20–29)
Calcium: 9.6 mg/dL (ref 8.6–10.2)
Chloride: 100 mmol/L (ref 96–106)
Creatinine, Ser: 0.71 mg/dL — ABNORMAL LOW (ref 0.76–1.27)
GFR calc Af Amer: 115 mL/min/{1.73_m2} (ref >59)
GFR calc non Af Amer: 99 mL/min/{1.73_m2} (ref >59)
Glucose: 170 mg/dL — ABNORMAL HIGH (ref 65–99)
Potassium: 5 mmol/L (ref 3.5–5.2)
Sodium: 138 mmol/L (ref 134–144)

## 2018-12-07 NOTE — Telephone Encounter (Signed)
Called pt re: lab results, left a message for him to call back  

## 2018-12-07 NOTE — Telephone Encounter (Signed)
-----   Message from Laurann Montanaayna N Dunn, [place]C sent at 12/07/2018 12:49 PM EST ----- I am covering [place] box just for today. Labs stable. Blood count improving but still anemic with some increase in platelets as well. Nothing to do acutely from cardiac standpoint but needs to f/u with CBC for evaluation/trending. Dayna Dunn PA-C

## 2018-12-09 NOTE — Addendum Note (Signed)
Addendum  created 12/09/18 1935 by Dorris SinghGreen, Kristeen Lantz, MD   Clinical Note Signed, Intraprocedure Blocks edited, LDA updated via procedure documentation

## 2018-12-09 NOTE — Telephone Encounter (Signed)
Called pt re: lab results, he has been made aware that he needs to f/u with his pcp re: CBC eval / trending. Pt verbalized understanding.

## 2018-12-27 ENCOUNTER — Other Ambulatory Visit: Payer: Self-pay | Admitting: Cardiothoracic Surgery

## 2018-12-27 DIAGNOSIS — Z951 Presence of aortocoronary bypass graft: Secondary | ICD-10-CM

## 2018-12-28 ENCOUNTER — Ambulatory Visit (INDEPENDENT_AMBULATORY_CARE_PROVIDER_SITE_OTHER): Payer: Self-pay | Admitting: Cardiothoracic Surgery

## 2018-12-28 ENCOUNTER — Ambulatory Visit
Admission: RE | Admit: 2018-12-28 | Discharge: 2018-12-28 | Disposition: A | Discharge status: Home / Self Care | Payer: BLUE CROSS/BLUE SHIELD | Source: Ambulatory Visit | Attending: Cardiothoracic Surgery | Admitting: Cardiothoracic Surgery

## 2018-12-28 ENCOUNTER — Encounter: Payer: Self-pay | Admitting: Cardiothoracic Surgery

## 2018-12-28 ENCOUNTER — Other Ambulatory Visit: Payer: Self-pay

## 2018-12-28 VITALS — BP 117/77 | HR 95 | Resp 16 | Ht 71.0 in | Wt 168.0 lb

## 2018-12-28 DIAGNOSIS — I2510 Atherosclerotic heart disease of native coronary artery without angina pectoris: Secondary | ICD-10-CM

## 2018-12-28 DIAGNOSIS — Z951 Presence of aortocoronary bypass graft: Secondary | ICD-10-CM

## 2018-12-28 NOTE — Progress Notes (Signed)
PCP is Elizabeth PalauAnderson, Teresa, FNP Referring Provider is Jake BatheSkains, Mark C, MD  Chief Complaint  Patient presents with  . Routine Post Op    s/p CABG X 5...11/18/18...with a CXR    HPI: Schedule I month follow-up after urgent CABG x5.  The patient presented with STEMI from occluded circumflex marginal.  This was opened with PTCA, the patient was stabilized, and he underwent CABG x5.  He did well postop without atrial fibrillation.  He had a mild left pleural effusion at discharge.  He is a non-insulin-dependent diabetic.  Since discharge the patient is felt increasingly better.  He is walking 20 to 30 minutes daily.  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  He denies recurrent angina and incisions are all healing well.  He is on Plavix for at least 3 months after surgery.  He has had no bleeding problems and his last hematocrit was 34%  Chest x-ray performed today shows resolution of the left pleural effusion, clear lung fields, sternal wires intact.  Past Medical History:  Diagnosis Date  . Diabetes mellitus without complication (HCC)   . Hyperlipidemia   . Hypertension     Past Surgical History:  Procedure Laterality Date  . CORONARY ARTERY BYPASS GRAFT N/A 11/18/2018   Procedure: CORONARY ARTERY BYPASS GRAFTING (CABG) TIMES FIVE USING LEFT INTERNAL MAMMARY ARTERY AND RIGHT GREATER SAPHENOUS VEIN HARVESTED ENDOSCOPICALLY.;  Surgeon: Kerin PernaVan Trigt, Flannery Cavallero, MD;  [facility];  Service: Open Heart Surgery;  Laterality: N/A;  . LEFT HEART CATH AND CORONARY ANGIOGRAPHY N/A 11/15/2018   Procedure: LEFT HEART CATH AND CORONARY ANGIOGRAPHY;  Surgeon: Lennette BihariKelly, Thomas A, MD;  Location: [facility] INVAS[facility] CV LAB;  Service: Cardiovascular;  Laterality: N/A;  . TEE WITHOUT CARDIOVERSION N/A 11/18/2018   Procedure: TRANSESOPHAGEAL ECHOCARDIOGRAM (TEE);  Surgeon: Donata ClayVan Trigt, Theron AristaPeter, MD;  [facility];  Service: Open Heart Surgery;   Laterality: N/A;    History reviewed. No pertinent family history.  Social History Social History   Tobacco Use  . Smoking status: Never Smoker  . Smokeless tobacco: Never Used  Substance Use Topics  . Alcohol use: Yes    Comment: occ  . Drug use: Never    Current Outpatient Medications  Medication Sig Dispense Refill  . aspirin EC 81 MG EC tablet Take 1 tablet (81 mg total) by mouth daily.    Marland Kitchen. atorvastatin (LIPITOR) 80 MG tablet Take 1 tablet (80 mg total) by mouth daily at 6 PM. 30 tablet 1  . carvedilol (COREG) 3.125 MG tablet Take 1 tablet (3.125 mg total) by mouth 2 (two) times daily with a meal. 60 tablet 1  . clopidogrel (PLAVIX) 75 MG tablet Take 1 tablet (75 mg total) by mouth daily. 30 tablet 1  . glimepiride (AMARYL) 4 MG tablet Take 6 mg by mouth daily before breakfast.    . lisinopril (PRINIVIL,ZESTRIL) 2.5 MG tablet Take 1 tablet (2.5 mg total) by mouth daily. 30 tablet 1  . metFORMIN (GLUCOPHAGE) 1000 MG tablet Take 1,000 mg by mouth 2 (two) times daily with a meal.     No current facility-administered medications for this visit.     No Known Allergies  Review of Systems  Improved strength and appetite Sleeping better No shortness of breath  BP 117/77 (BP Location: Left Arm, Patient Position: Sitting, Cuff Size: Large)   Pulse 95   Resp 16   Ht 5\' 11"  (1.803 m)  Wt 168 lb (76.2 kg)   SpO2 96% Comment: ON RA  BMI 23.43 kg/m  Physical Exam      Exam    General- alert and comfortable    Neck- no JVD, no cervical adenopathy palpable, no carotid bruit   Lungs- clear without rales, wheezes.  Sternal incision well-healed and stable.   Cor- regular rate and rhythm, no murmur , gallop   Abdomen- soft, non-tender   Extremities - warm, non-tender, minimal edema   Neuro- oriented, appropriate, no focal weakness   Diagnostic Tests: Chest x-ray is clear.  Impression: Excellent early recovery after urgent multivessel CABG The patient can now lift up to  20 pounds but not more until after March 1. I provided him a return to work paper for early March. He will continue his current medications I discussed in detail the importance and components of a heart healthy diet and lifestyle.  He understands he should remain on aspirin and Lipitor long-term.  Plan: Return as needed.  Follow-up has been arranged with the cardiology office   Mikey Bussing, MD [facility] [phone]

## 2019-01-06 ENCOUNTER — Encounter: Payer: Self-pay | Admitting: Cardiology

## 2019-01-24 ENCOUNTER — Ambulatory Visit: Payer: BLUE CROSS/BLUE SHIELD | Admitting: Cardiology

## 2019-01-24 NOTE — Progress Notes (Deleted)
Cardiology Office Note:    Date:  01/24/2019   ID:  Philip [name], DOB 24-Dec-1955, MRN [id number]  PCP:  Elizabeth PalauAnderson, Teresa, FNP  Cardiologist:  Donato SchultzMark Ayeshia Coppin, MD  Electrophysiologist:  None   Referring MD: Elizabeth PalauAnderson, Teresa, FNP    History of Present Illness:    Philip SicklesWesley A Reep is a 63 y.o. male with coronary artery disease status post CABG, Dr. Maren BeachVantrigt post STEMI 10/2018 with prior PTCA only of circumflex, diabetes with hypertension, ischemic cardiomyopathy EF 45% prior to CABG here for follow-up.  No family history of coronary artery disease.  Had STEMI on 11/14/2018 originally intermittent went to work early that morning but then began to have more chest discomfort with no radiation.  Went to urgent care-ER 1 to 2 mm of ST segment elevation inferior leads and troponin was 18 with peak troponin greater than 65.  Cardiac catheterization revealed severe multivessel CAD.  Past Medical History:  Diagnosis Date  . Diabetes mellitus without complication (HCC)   . Hyperlipidemia   . Hypertension     Past Surgical History:  Procedure Laterality Date  . CORONARY ARTERY BYPASS GRAFT N/A 11/18/2018   Procedure: CORONARY ARTERY BYPASS GRAFTING (CABG) TIMES FIVE USING LEFT INTERNAL MAMMARY ARTERY AND RIGHT GREATER SAPHENOUS VEIN HARVESTED ENDOSCOPICALLY.;  Surgeon: Kerin PernaVan Trigt, Peter, MD;  [facility];  Service: Open Heart Surgery;  Laterality: N/A;  . LEFT HEART CATH AND CORONARY ANGIOGRAPHY N/A 11/15/2018   Procedure: LEFT HEART CATH AND CORONARY ANGIOGRAPHY;  Surgeon: Lennette BihariKelly, Thomas A, MD;  Location: [facility] INVASIVE [facility];  Service: Cardiovascular;  Laterality: N/A;  . TEE WITHOUT CARDIOVERSION N/A 11/18/2018   Procedure: TRANSESOPHAGEAL ECHOCARDIOGRAM (TEE);  Surgeon: Donata ClayVan Trigt, Theron AristaPeter, MD;  [facility];  Service: Open Heart Surgery;  Laterality: N/A;    Current Medications: No outpatient medications have been marked as taking for the 01/24/19 encounter (Appointment) with Jake BatheSkains, Aizik Reh C,  MD.     Allergies:   Patient has no known allergies.   Social History   Socioeconomic History  . Marital status: Single    Spouse name: Not on file  . Number of children: Not on file  . Years of education: Not on file  . Highest education level: Not on file  Occupational History  . Not on file  Social Needs  . Financial resource strain: Not on file  . Food insecurity:    Worry: Not on file    Inability: Not on file  . Transportation needs:    Medical: Not on file    Non-medical: Not on file  Tobacco Use  . Smoking status: Never Smoker  . Smokeless tobacco: Never Used  Substance and Sexual Activity  . Alcohol use: Yes    Comment: occ  . Drug use: Never  . Sexual activity: Not on file  Lifestyle  . Physical activity:    Days per week: Not on file    Minutes per session: Not on file  . Stress: Not on file  Relationships  . Social connections:    Talks on phone: Not on file    Gets together: Not on file    Attends religious service: Not on file    Active member of club or organization: Not on file    Attends meetings of clubs or organizations: Not on file    Relationship status: Not on file  Other Topics Concern  . Not on file  Social History Narrative  . Not on file     Family  History: The patient's family history is not on file.  No early family history of coronary artery disease  ROS:   Please see the history of present illness.     All other systems reviewed and are negative.  EKGs/Labs/Other Studies Reviewed:    The following studies were reviewed today: Cardiac catheterization echocardiogram EKG lab work hospital notes  EKG:  Not done. Prior NSR  Recent Labs: 11/17/2018: TSH 1.740 11/19/2018: Magnesium 2.2 11/23/2018: ALT 27 12/06/2018: BUN 11; Creatinine, Ser 0.71; Hemoglobin 10.9; Platelets 523; Potassium 5.0; Sodium 138  Recent Lipid Panel    Component Value Date/Time   CHOL 95 11/16/2018 0651   TRIG 75 11/16/2018 0651   HDL 26 (L)  11/16/2018 0651   CHOLHDL 3.7 11/16/2018 0651   VLDL 15 11/16/2018 0651   LDLCALC 54 11/16/2018 0651    Physical Exam:    VS:  There were no vitals taken for this visit.    Wt Readings from Last 3 Encounters:  12/28/18 168 lb (76.2 kg)  12/06/18 170 lb 6.4 oz (77.3 kg)  11/23/18 174 lb 2.6 oz (79 kg)     GEN: *** Well nourished, well developed in no acute distress HEENT: Normal NECK: No JVD; No carotid bruits LYMPHATICS: No lymphadenopathy CARDIAC: ***RRR, no murmurs, rubs, gallops RESPIRATORY:  Clear to auscultation without rales, wheezing or rhonchi  ABDOMEN: Soft, non-tender, non-distended MUSCULOSKELETAL:  No edema; No deformity  SKIN: Warm and dry NEUROLOGIC:  Alert and oriented x 3 PSYCHIATRIC:  Normal affect   ASSESSMENT:    1. ST elevation myocardial infarction involving left circumflex coronary artery (HCC)   2. S/P CABG x 5   3. Type 2 diabetes mellitus without complication, without long-term current use of insulin (HCC)   4. Mixed hyperlipidemia   5. Essential hypertension    PLAN:    In order of problems listed above:  STEMI/coronary artery disease/CABG - CABG x5 LIMA to LAD SVG to diagonal SVG to OM SVG sequential to PDA and posterior lateral branch.  On Plavix for at least 3 months post surgery.  Awaiting to lift greater than 20 founds till after March 1. -Echo EF was 45% prior to CABG.  Diabetes with hypertension -Following up with primary care, Elizabeth Palau, FNP.  Hyperlipidemia - High intensity statin.  LDL goal less than 70.  Essential hypertension -Beta-blocker lisinopril.   Medication Adjustments/Labs and Tests Ordered: Current medicines are reviewed at length with the patient today.  Concerns regarding medicines are outlined above.  No orders of the defined types were placed in this encounter.  No orders of the defined types were placed in this encounter.   There are no Patient Instructions on file for this visit.   Signed, Donato Schultz, MD  01/24/2019 9:35 AM    [facility]

## 2019-01-25 ENCOUNTER — Encounter: Payer: Self-pay | Admitting: Cardiology

## 2019-01-27 ENCOUNTER — Other Ambulatory Visit: Payer: Self-pay | Admitting: Physician Assistant

## 2019-01-30 ENCOUNTER — Other Ambulatory Visit: Payer: Self-pay | Admitting: Physician Assistant

## 2019-03-08 ENCOUNTER — Other Ambulatory Visit: Payer: Self-pay | Admitting: Cardiothoracic Surgery

## 2019-05-10 ENCOUNTER — Other Ambulatory Visit: Payer: Self-pay | Admitting: Cardiothoracic Surgery

## 2019-05-16 ENCOUNTER — Other Ambulatory Visit: Payer: Self-pay | Admitting: Cardiothoracic Surgery

## 2019-07-18 ENCOUNTER — Other Ambulatory Visit: Payer: Self-pay | Admitting: Cardiothoracic Surgery

## 2019-07-25 ENCOUNTER — Other Ambulatory Visit: Payer: Self-pay | Admitting: Cardiothoracic Surgery

## 2019-07-25 ENCOUNTER — Other Ambulatory Visit: Payer: Self-pay | Admitting: Physician Assistant

## 2019-08-04 ENCOUNTER — Other Ambulatory Visit: Payer: Self-pay | Admitting: Cardiothoracic Surgery

## 2022-11-19 ENCOUNTER — Telehealth: Payer: BLUE CROSS/BLUE SHIELD | Admitting: Physician Assistant

## 2022-11-19 DIAGNOSIS — J208 Acute bronchitis due to other specified organisms: Secondary | ICD-10-CM

## 2022-11-19 DIAGNOSIS — B9689 Other specified bacterial agents as the cause of diseases classified elsewhere: Secondary | ICD-10-CM

## 2022-11-19 MED ORDER — BENZONATATE 100 MG PO CAPS: 100.0000 mg | ORAL_CAPSULE | Freq: Three times a day (TID) | ORAL | 0 refills | Status: AC | PRN

## 2022-11-19 MED ORDER — DOXYCYCLINE HYCLATE 100 MG PO TABS: 100.0000 mg | ORAL_TABLET | Freq: Two times a day (BID) | ORAL | 0 refills | Status: AC

## 2022-11-19 NOTE — Progress Notes (Signed)

## 2022-11-19 NOTE — Progress Notes (Signed)
I have spent 5 minutes in review of e-visit questionnaire, review and updating patient chart, medical decision making and response to patient.   Eldana Isip Cody Adie Vilar, PA-C
